# Patient Record
Sex: Male | Born: 1980 | Race: Black or African American | Hispanic: No | Marital: Single | Smoking: Current every day smoker
Health system: Southern US, Community
[De-identification: ages and names within clinical notes are randomized; demographics above are authoritative.]

## PROBLEM LIST (undated history)

## (undated) DIAGNOSIS — F32A Depression, unspecified: Secondary | ICD-10-CM

## (undated) DIAGNOSIS — J3501 Chronic tonsillitis: Principal | ICD-10-CM

## (undated) DIAGNOSIS — F329 Major depressive disorder, single episode, unspecified: Secondary | ICD-10-CM

## (undated) HISTORY — PX: WISDOM TOOTH EXTRACTION: SHX21

---

## 2006-02-11 ENCOUNTER — Emergency Department (HOSPITAL_COMMUNITY): Admission: EM | Admit: 2006-02-11 | Discharge: 2006-02-11 | Payer: Self-pay | Admitting: Emergency Medicine

## 2006-04-26 ENCOUNTER — Emergency Department (HOSPITAL_COMMUNITY): Admission: EM | Admit: 2006-04-26 | Discharge: 2006-04-26 | Payer: Self-pay | Admitting: Emergency Medicine

## 2012-01-03 ENCOUNTER — Inpatient Hospital Stay (HOSPITAL_COMMUNITY)
Admission: EM | Admit: 2012-01-03 | Discharge: 2012-01-08 | DRG: 153 | Disposition: A | Discharge status: Home / Self Care | Payer: 59 | Source: Ambulatory Visit | Attending: Internal Medicine | Admitting: Internal Medicine

## 2012-01-03 ENCOUNTER — Encounter (HOSPITAL_COMMUNITY): Payer: Self-pay | Admitting: Emergency Medicine

## 2012-01-03 DIAGNOSIS — R509 Fever, unspecified: Secondary | ICD-10-CM

## 2012-01-03 DIAGNOSIS — N179 Acute kidney failure, unspecified: Secondary | ICD-10-CM | POA: Diagnosis present

## 2012-01-03 DIAGNOSIS — F17200 Nicotine dependence, unspecified, uncomplicated: Secondary | ICD-10-CM | POA: Diagnosis present

## 2012-01-03 DIAGNOSIS — N289 Disorder of kidney and ureter, unspecified: Secondary | ICD-10-CM | POA: Diagnosis present

## 2012-01-03 DIAGNOSIS — J069 Acute upper respiratory infection, unspecified: Principal | ICD-10-CM | POA: Diagnosis present

## 2012-01-03 DIAGNOSIS — E860 Dehydration: Secondary | ICD-10-CM

## 2012-01-03 DIAGNOSIS — B2790 Infectious mononucleosis, unspecified without complication: Secondary | ICD-10-CM | POA: Diagnosis present

## 2012-01-03 DIAGNOSIS — J029 Acute pharyngitis, unspecified: Secondary | ICD-10-CM

## 2012-01-03 DIAGNOSIS — B349 Viral infection, unspecified: Secondary | ICD-10-CM | POA: Diagnosis present

## 2012-01-03 DIAGNOSIS — J039 Acute tonsillitis, unspecified: Secondary | ICD-10-CM

## 2012-01-03 LAB — RAPID STREP SCREEN (MED CTR MEBANE ONLY): Streptococcus, Group A Screen (Direct): NEGATIVE

## 2012-01-03 MED ORDER — ACETAMINOPHEN 325 MG PO TABS
650.0000 mg | ORAL_TABLET | Freq: Once | ORAL | Status: AC
  Administered 2012-01-03: 650 mg via ORAL
  Filled 2012-01-03: qty 2

## 2012-01-03 NOTE — ED Notes (Signed)
C/o sore throat and fever since yesterday

## 2012-01-04 ENCOUNTER — Emergency Department (HOSPITAL_COMMUNITY): Payer: 59

## 2012-01-04 ENCOUNTER — Encounter (HOSPITAL_COMMUNITY): Payer: Self-pay | Admitting: Emergency Medicine

## 2012-01-04 DIAGNOSIS — B349 Viral infection, unspecified: Secondary | ICD-10-CM | POA: Diagnosis present

## 2012-01-04 DIAGNOSIS — J029 Acute pharyngitis, unspecified: Secondary | ICD-10-CM

## 2012-01-04 DIAGNOSIS — B9789 Other viral agents as the cause of diseases classified elsewhere: Secondary | ICD-10-CM

## 2012-01-04 DIAGNOSIS — E860 Dehydration: Secondary | ICD-10-CM

## 2012-01-04 DIAGNOSIS — N289 Disorder of kidney and ureter, unspecified: Secondary | ICD-10-CM | POA: Diagnosis present

## 2012-01-04 DIAGNOSIS — R3129 Other microscopic hematuria: Secondary | ICD-10-CM

## 2012-01-04 DIAGNOSIS — B2790 Infectious mononucleosis, unspecified without complication: Secondary | ICD-10-CM

## 2012-01-04 LAB — COMPREHENSIVE METABOLIC PANEL
Albumin: 4.4 g/dL (ref 3.5–5.2)
Alkaline Phosphatase: 93 U/L (ref 39–117)
BUN: 13 mg/dL (ref 6–23)
Chloride: 97 mEq/L (ref 96–112)
Glucose, Bld: 113 mg/dL — ABNORMAL HIGH (ref 70–99)
Potassium: 4.2 mEq/L (ref 3.5–5.1)
Total Bilirubin: 0.3 mg/dL (ref 0.3–1.2)

## 2012-01-04 LAB — CBC
HCT: 47.2 % (ref 39.0–52.0)
Hemoglobin: 16 g/dL (ref 13.0–17.0)
RBC: 5.61 MIL/uL (ref 4.22–5.81)
WBC: 19.5 10*3/uL — ABNORMAL HIGH (ref 4.0–10.5)

## 2012-01-04 LAB — DIFFERENTIAL
Basophils Absolute: 0 10*3/uL (ref 0.0–0.1)
Basophils Relative: 0 % (ref 0–1)
Lymphocytes Relative: 15 % (ref 12–46)
Monocytes Relative: 14 % — ABNORMAL HIGH (ref 3–12)
Neutro Abs: 13.9 10*3/uL — ABNORMAL HIGH (ref 1.7–7.7)

## 2012-01-04 LAB — RAPID URINE DRUG SCREEN, HOSP PERFORMED
Barbiturates: NOT DETECTED
Cocaine: NOT DETECTED
Tetrahydrocannabinol: POSITIVE — AB

## 2012-01-04 LAB — PATHOLOGIST SMEAR REVIEW

## 2012-01-04 LAB — EXPECTORATED SPUTUM ASSESSMENT W GRAM STAIN, RFLX TO RESP C

## 2012-01-04 LAB — URINE MICROSCOPIC-ADD ON

## 2012-01-04 LAB — MONONUCLEOSIS SCREEN: Mono Screen: NEGATIVE

## 2012-01-04 LAB — URINALYSIS, ROUTINE W REFLEX MICROSCOPIC
Bilirubin Urine: NEGATIVE
Ketones, ur: 15 mg/dL — AB
Nitrite: NEGATIVE
pH: 5.5 (ref 5.0–8.0)

## 2012-01-04 LAB — POCT I-STAT, CHEM 8
HCT: 51 % (ref 39.0–52.0)
Hemoglobin: 17.3 g/dL — ABNORMAL HIGH (ref 13.0–17.0)
Potassium: 4.1 mEq/L (ref 3.5–5.1)
Sodium: 136 mEq/L (ref 135–145)

## 2012-01-04 LAB — LACTIC ACID, PLASMA: Lactic Acid, Venous: 1.1 mmol/L (ref 0.5–2.2)

## 2012-01-04 MED ORDER — MORPHINE SULFATE 2 MG/ML IJ SOLN
2.0000 mg | Freq: Once | INTRAMUSCULAR | Status: AC
  Administered 2012-01-04: 2 mg via INTRAVENOUS
  Filled 2012-01-04: qty 1

## 2012-01-04 MED ORDER — SODIUM CHLORIDE 0.9 % IV SOLN
1000.0000 mL | Freq: Once | INTRAVENOUS | Status: AC
  Administered 2012-01-04: 1000 mL via INTRAVENOUS

## 2012-01-04 MED ORDER — MORPHINE SULFATE 2 MG/ML IJ SOLN
2.0000 mg | INTRAMUSCULAR | Status: DC | PRN
  Administered 2012-01-06: 2 mg via INTRAVENOUS
  Filled 2012-01-04: qty 1

## 2012-01-04 MED ORDER — ACETAMINOPHEN 325 MG PO TABS: 650.0000 mg | ORAL_TABLET | Freq: Once | ORAL | Status: DC

## 2012-01-04 MED ORDER — SODIUM CHLORIDE 0.9 % IV SOLN
3.0000 g | Freq: Once | INTRAVENOUS | Status: AC
  Administered 2012-01-04: 3 g via INTRAVENOUS
  Filled 2012-01-04: qty 3

## 2012-01-04 MED ORDER — ACETAMINOPHEN 325 MG PO TABS
975.0000 mg | ORAL_TABLET | Freq: Once | ORAL | Status: AC
  Administered 2012-01-04: 975 mg via ORAL
  Filled 2012-01-04: qty 3

## 2012-01-04 MED ORDER — ONDANSETRON HCL 4 MG/2ML IJ SOLN
4.0000 mg | Freq: Once | INTRAMUSCULAR | Status: AC
  Administered 2012-01-04: 4 mg via INTRAVENOUS
  Filled 2012-01-04: qty 2

## 2012-01-04 MED ORDER — ACETAMINOPHEN 325 MG PO TABS
650.0000 mg | ORAL_TABLET | ORAL | Status: DC | PRN
  Administered 2012-01-04 – 2012-01-06 (×7): 650 mg via ORAL
  Filled 2012-01-04: qty 1
  Filled 2012-01-04 (×7): qty 2

## 2012-01-04 MED ORDER — SODIUM CHLORIDE 0.9 % IV SOLN
1.5000 g | Freq: Four times a day (QID) | INTRAVENOUS | Status: DC
  Administered 2012-01-04 – 2012-01-07 (×13): 1.5 g via INTRAVENOUS
  Filled 2012-01-04 (×16): qty 1.5

## 2012-01-04 MED ORDER — PREDNISONE 20 MG PO TABS
40.0000 mg | ORAL_TABLET | Freq: Every day | ORAL | Status: AC
  Administered 2012-01-04 – 2012-01-06 (×3): 40 mg via ORAL
  Filled 2012-01-04 (×4): qty 2

## 2012-01-04 MED ORDER — SODIUM CHLORIDE 0.9 % IV SOLN
1000.0000 mL | INTRAVENOUS | Status: DC
  Administered 2012-01-04: 1000 mL via INTRAVENOUS

## 2012-01-04 MED ORDER — SODIUM CHLORIDE 0.9 % IJ SOLN
3.0000 mL | Freq: Two times a day (BID) | INTRAMUSCULAR | Status: DC
  Administered 2012-01-04 – 2012-01-07 (×3): 3 mL via INTRAVENOUS

## 2012-01-04 MED ORDER — ACETAMINOPHEN 500 MG PO TABS
500.0000 mg | ORAL_TABLET | Freq: Once | ORAL | Status: DC
  Filled 2012-01-04: qty 1

## 2012-01-04 MED ORDER — ENOXAPARIN SODIUM 40 MG/0.4ML ~~LOC~~ SOLN
40.0000 mg | SUBCUTANEOUS | Status: DC
  Filled 2012-01-04 (×3): qty 0.4

## 2012-01-04 MED ORDER — SODIUM CHLORIDE 0.9 % IV SOLN
INTRAVENOUS | Status: DC
  Administered 2012-01-04: 08:00:00 via INTRAVENOUS

## 2012-01-04 MED ORDER — IOHEXOL 300 MG/ML  SOLN
50.0000 mL | Freq: Once | INTRAMUSCULAR | Status: AC | PRN
  Administered 2012-01-04: 50 mL via INTRAVENOUS

## 2012-01-04 MED ORDER — SODIUM CHLORIDE 0.9 % IV BOLUS (SEPSIS)
1000.0000 mL | Freq: Once | INTRAVENOUS | Status: AC
  Administered 2012-01-04: 1000 mL via INTRAVENOUS

## 2012-01-04 NOTE — Progress Notes (Signed)
Subjective: Patient denies headache, he had some dizziness. He denies muscle pain, abdominal pain.  No cough. Some difficulty with swallowing, mild SOB. Had 1 episode of diarrhea last Tuesday.   Objective: Filed Vitals:   01/04/12 0518 01/04/12 0524 01/04/12 0627 01/04/12 0737  BP: 133/75 133/75 121/63   Pulse:   118   Temp: 103 F (39.4 C) 103 F (39.4 C) 102.9 F (39.4 C) 99.9 F (37.7 C)  TempSrc: Oral  Oral Oral  Resp: 18 18 16    Height:   5\' 10"  (1.778 m)   Weight:   99.791 kg (220 lb)   SpO2: 100% 98% 99%    Weight change:    General: Alert, awake, oriented x3, in no acute distress.  HEENT: No bruits, no goiter.  Heart: Regular rate and rhythm, without murmurs, rubs, gallops.  Lungs: CTA, bilateral air movement.  Abdomen: Soft, nontender, nondistended, positive bowel sounds.  Neuro: Grossly intact, nonfocal. Extremities; no edema, no rash.   Lab Results:  [facility] 01/04/12 0103 01/04/12 0041  NA 136 133*  K 4.1 4.2  CL 102 97  CO2 -- 21  GLUCOSE 115* 113*  BUN 13 13  CREATININE 2.00* 1.80*  CALCIUM -- 10.2  MG -- --  PHOS -- --    Basename 01/04/12 0041  AST 23  ALT 35  ALKPHOS 93  BILITOT 0.3  PROT 9.0*  ALBUMIN 4.4   No results found for this basename: LIPASE:2,AMYLASE:2 in the last 72 hours  Basename 01/04/12 0103 01/04/12 0041  WBC -- 19.5*  NEUTROABS -- 13.9*  HGB 17.3* 16.0  HCT 51.0 47.2  MCV -- 84.1  PLT -- 242    Micro Results: Recent Results (from the past 240 hour(s))  RAPID STREP SCREEN     Status: Normal   Collection Time   01/03/12 11:31 PM      Component Value Range Status Comment   Streptococcus, Group A Screen (Direct) NEGATIVE  NEGATIVE Final     Studies/Results: Dg Neck Soft Tissue  01/04/2012  *RADIOLOGY REPORT*  Clinical Data: Fever and sore throat for 3 days.  NECK SOFT TISSUES - 1+ VIEW  Comparison: None.  Findings: Prevertebral soft tissues are within normal limits. Epiglottis within normal limits.   Aryepiglottic folds not thickened.  Soft tissue fullness at the region of the piriform sinuses is favored to be due to underdistension.  No airway impingement.  IMPRESSION: No acute findings.  Original Report Authenticated By: Consuello Bossier, M.D.   Ct Soft Tissue Neck W Contrast  01/04/2012  *RADIOLOGY REPORT*  Clinical Data: Fever, sore throat.  CT NECK WITH CONTRAST  Technique:  Multidetector CT imaging of the neck was performed with intravenous contrast.  Contrast: 50mL OMNIPAQUE IOHEXOL 300 MG/ML  SOLN  Comparison: 01/04/2012 radiograph  Findings: Lung apices are clear.  Limited images through the posterior fossa/lower temporal lobes show no acute intracranial finding.  Partially opacified right maxillary sinus may represent a polyp. A small polyp versus mucosal thickening within each sphenoid chamber. Visualized paranasal sinuses and mastoid air cells are otherwise predominately clear.  Unremarkable nasal cavity and nasopharynx.  Unremarkable oral cavity.  Bilateral tonsillar fullness narrows the oropharyngeal airway however, no peritonsillar abscess identified.  Unremarkable hypopharynx and epiglottis.  Unremarkable larynx.  Symmetric parotid and submandibular glands.  There are bilateral prominent cervical chain lymph nodes, measuring up to 1.6 cm short axis.  Normal caliber vasculature.  Unremarkable thyroid gland.  No acute osseous finding.  IMPRESSION: Tonsillar fullness narrows the  oropharyngeal airway without peritonsillar abscess identified.  Prominent bilateral cervical chain lymph nodes.  May be reactive however recommend continued attention with physical exam and follow up imaging if warranted to document resolution.  Original Report Authenticated By: Waneta Martins, M.D.   Dg Chest Port 1 View  01/04/2012  *RADIOLOGY REPORT*  Clinical Data: Fever, sore throat.  PORTABLE CHEST - 1 VIEW  Comparison: None.  Findings: Lungs are clear. No pleural effusion or pneumothorax. The cardiomediastinal  contours are within normal limits. The visualized bones and soft tissues are without significant appreciable abnormality.  IMPRESSION: No radiographic evidence of acute cardiopulmonary process.  Original Report Authenticated By: Waneta Martins, M.D.    Medications: I have reviewed the patient's current medications.  1-Pharyngitis: Continue with unasyn. MONO negative. Will monitor closely for airway compromise.   2-Fever, Leukocytosis: Probably related to Pharyngitis, probably bacterial infection, ANC at 13. No headaches, no neck stiffness. I will check HIV. Will follow Blood culture, UA. Strept group A negative. Will check GC/Chlamydia. LFT normal.   3-Acute renal failure: probably pre-renal. IV fluids. Also patient received contrast for CT neck. Strict I and O. Will consider renal US if no improvement.     LOS: 1 day   Allsion Nogales M.D.  [facility] 01/04/2012, 8:25 AM

## 2012-01-04 NOTE — Care Management Note (Signed)
    Page 1 of 1   01/04/2012     11:26:41 AM   CARE MANAGEMENT NOTE 01/04/2012  Patient:  ACY, ORSAK   Account Number:  [id number]  Date Initiated:  01/04/2012  Documentation initiated by:  GRAVES-BIGELOW,Skye Rodarte  Subjective/Objective Assessment:   Pt admitted with fever and sore throat. 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 Pt will bring card for hospital to copy.     Action/Plan:   CM will provide pt with the [facility] number in order to find a PCP. No further needs from CM at this time.   Anticipated DC Date:  01/07/2012   Anticipated DC Plan:  HOME/SELF CARE      DC Planning Services  CM consult      Choice offered to / List presented to:             Status of service:  Completed, signed off Medicare Important Message given?   (If response is "NO", the following Medicare IM given date fields will be blank) Date Medicare IM given:   Date Additional Medicare IM given:    Discharge Disposition:  HOME/SELF CARE  Per UR Regulation:  Reviewed for med. necessity/level of care/duration of stay  If discussed at Long Length of Stay Meetings, dates discussed:    Comments:

## 2012-01-04 NOTE — ED Notes (Signed)
Patient transported to CT 

## 2012-01-04 NOTE — ED Notes (Signed)
Patient with three day history of sore throat, increasing shortness of breath and swollen tonsils.  Patient is febrile.  Patient has been nauseated, no history of vomiting.

## 2012-01-04 NOTE — H&P (Signed)
PCP: None   Chief Complaint: Sore throat, fever, chills   HPI: Alexander Franklin is an 31 y.o. male with benign past medical history on no chronic medication presents to emergency room with three-day history of sore throat, odynophagia, fever, chills, and one-day history of loose stool. He denied shortness of breath, cough, abdominal cramps or pain, nausea or vomiting, dysuria or polyuria. He has not been out of the country, denied any drug use, denied any increased risk for STD. His girlfriend had upper respiratory symptoms one week prior and was placed on Biaxin. Evaluation in emergency room included a white count of 19.5 thousand with atypical lymphocytes. He has elevated creatinine to 1.8, serum sodium 133, and normal liver function tests. Rapid strep is negative. His CT scan of the neck shows enlarged tonsils narrowing its space, but no abcesses. Urinalysis is positive for 7-10 RBC, rare bacteria and 0-2 wbc. Hospitalist was asked to admit patient for renal insufficiency, and further evaluation pharyngitis.  Rewiew of Systems:  The patient denies anorexia, weight loss,, vision loss, decreased hearing, hoarseness, chest pain, syncope, dyspnea on exertion, peripheral edema, balance deficits, hemoptysis, abdominal pain, melena, hematochezia, severe indigestion/heartburn, hematuria, incontinence, genital sores, muscle weakness, suspicious skin lesions, transient blindness, difficulty walking, depression, unusual weight change, abnormal bleeding,  angioedema, and breast masses.    History reviewed. No pertinent past medical history.  History reviewed. No pertinent past surgical history.  Medications:  HOME MEDS: Prior to Admission medications   Medication Sig Start Date End Date Taking? Authorizing Provider  Phenylephrine-Pheniramine-DM [facility] COLD & COUGH PO) Take 1 tablet by mouth every 6 (six) hours as needed. For cold symptoms   Yes Historical Provider, MD  Pseudoeph-Doxylamine-DM-APAP  (NYQUIL PO) Take 30 mLs by mouth every 6 (six) hours as needed. For cough symptoms   Yes Historical Provider, MD     Allergies:  No Known Allergies  Social History:   reports that he has been smoking.  He does not have any smokeless tobacco history on file. He reports that he drinks alcohol. He reports that he does not use illicit drugs.  Family History: History reviewed. No pertinent family history.   Physical Exam: Filed Vitals:   01/04/12 0410 01/04/12 0443 01/04/12 0518 01/04/12 0524  BP:  131/87 133/75 133/75  Pulse:      Temp: 102.9 F (39.4 C)  103 F (39.4 C) 103 F (39.4 C)  TempSrc:   Oral   Resp:  16 18 18   SpO2:  100% 100% 98%   Blood pressure 133/75, pulse 137, temperature 103 F (39.4 C), temperature source Oral, resp. rate 18, SpO2 98.00%.  GEN:  Pleasant  person lying in the stretcher in no acute distress; cooperative with exam PSYCH:  alert and oriented x4; does not appear anxious or depressed; affect is appropriate. HEENT: Mucous membranes pink and anicteric; PERRLA; EOM intact; no cervical lymphadenopathy nor thyromegaly or carotid bruit; no JVD; he has exudative tonsils. His next is supple Breasts:: Not examined CHEST WALL: No tenderness CHEST: Normal respiration, clear to auscultation bilaterally HEART: Regular rate and rhythm; no murmurs rubs or gallops BACK: No kyphosis or scoliosis; no CVA tenderness ABDOMEN: Obese, soft non-tender; no masses, no organomegaly, normal abdominal bowel sounds; no pannus; no intertriginous candida. Rectal Exam: Not done EXTREMITIES: No bone or joint deformity; age-appropriate arthropathy of the hands and knees; no edema; no ulcerations. Genitalia: not examined PULSES: 2+ and symmetric SKIN: Normal hydration no rash or ulceration CNS: Cranial nerves 2-12 grossly intact no  focal lateralizing neurologic deficit   Labs & Imaging Results for orders placed during the hospital encounter of 01/03/12 (from the past 48  hour(s))  RAPID STREP SCREEN     Status: Normal   Collection Time   01/03/12 11:31 PM      Component Value Range Comment   Streptococcus, Group A Screen (Direct) NEGATIVE  NEGATIVE   CBC     Status: Abnormal   Collection Time   01/04/12 12:41 AM      Component Value Range Comment   WBC 19.5 (*) 4.0 - 10.5 K/uL    RBC 5.61  4.22 - 5.81 MIL/uL    Hemoglobin 16.0  13.0 - 17.0 g/dL    HCT 16.1  09.6 - 04.5 %    MCV 84.1  78.0 - 100.0 fL    MCH 28.5  26.0 - 34.0 pg    MCHC 33.9  30.0 - 36.0 g/dL    RDW 40.9  81.1 - 91.4 %    Platelets 242  150 - 400 K/uL   DIFFERENTIAL     Status: Abnormal   Collection Time   01/04/12 12:41 AM      Component Value Range Comment   Neutrophils Relative 71  43 - 77 %    Lymphocytes Relative 15  12 - 46 %    Monocytes Relative 14 (*) 3 - 12 %    Eosinophils Relative 0  0 - 5 %    Basophils Relative 0  0 - 1 %    Neutro Abs 13.9 (*) 1.7 - 7.7 K/uL    Lymphs Abs 2.9  0.7 - 4.0 K/uL    Monocytes Absolute 2.7 (*) 0.1 - 1.0 K/uL    Eosinophils Absolute 0.0  0.0 - 0.7 K/uL    Basophils Absolute 0.0  0.0 - 0.1 K/uL    WBC Morphology ATYPICAL LYMPHOCYTES     COMPREHENSIVE METABOLIC PANEL     Status: Abnormal   Collection Time   01/04/12 12:41 AM      Component Value Range Comment   Sodium 133 (*) 135 - 145 mEq/L    Potassium 4.2  3.5 - 5.1 mEq/L    Chloride 97  96 - 112 mEq/L    CO2 21  19 - 32 mEq/L    Glucose, Bld 113 (*) 70 - 99 mg/dL    BUN 13  6 - 23 mg/dL    Creatinine, Ser 7.82 (*) 0.50 - 1.35 mg/dL    Calcium 95.6  8.4 - 10.5 mg/dL    Total Protein 9.0 (*) 6.0 - 8.3 g/dL    Albumin 4.4  3.5 - 5.2 g/dL    AST 23  0 - 37 U/L    ALT 35  0 - 53 U/L    Alkaline Phosphatase 93  39 - 117 U/L    Total Bilirubin 0.3  0.3 - 1.2 mg/dL    GFR calc non Af Amer 48 (*) >90 mL/min    GFR calc Af Amer 56 (*) >90 mL/min   LACTIC ACID, PLASMA     Status: Normal   Collection Time   01/04/12 12:42 AM      Component Value Range Comment   Lactic Acid, Venous 1.1   0.5 - 2.2 mmol/L   POCT I-STAT, CHEM 8     Status: Abnormal   Collection Time   01/04/12  1:03 AM      Component Value Range Comment   Sodium 136  135 - 145  mEq/L    Potassium 4.1  3.5 - 5.1 mEq/L    Chloride 102  96 - 112 mEq/L    BUN 13  6 - 23 mg/dL    Creatinine, Ser 1.61 (*) 0.50 - 1.35 mg/dL    Glucose, Bld 096 (*) 70 - 99 mg/dL    Calcium, Ion 0.45  4.09 - 1.32 mmol/L    TCO2 22  0 - 100 mmol/L    Hemoglobin 17.3 (*) 13.0 - 17.0 g/dL    HCT 81.1  91.4 - 78.2 %   URINALYSIS, ROUTINE W REFLEX MICROSCOPIC     Status: Abnormal   Collection Time   01/04/12  1:44 AM      Component Value Range Comment   Color, Urine AMBER (*) YELLOW BIOCHEMICALS MAY BE AFFECTED BY COLOR   APPearance CLEAR  CLEAR    Specific Gravity, Urine 1.036 (*) 1.005 - 1.030    pH 5.5  5.0 - 8.0    Glucose, UA NEGATIVE  NEGATIVE mg/dL    Hgb urine dipstick MODERATE (*) NEGATIVE    Bilirubin Urine NEGATIVE  NEGATIVE    Ketones, ur 15 (*) NEGATIVE mg/dL    Protein, ur 956 (*) NEGATIVE mg/dL    Urobilinogen, UA 0.2  0.0 - 1.0 mg/dL    Nitrite NEGATIVE  NEGATIVE    Leukocytes, UA TRACE (*) NEGATIVE   URINE MICROSCOPIC-ADD ON     Status: Abnormal   Collection Time   01/04/12  1:44 AM      Component Value Range Comment   Squamous Epithelial / LPF RARE  RARE    WBC, UA 0-2  <3 WBC/hpf    RBC / HPF 7-10  <3 RBC/hpf    Bacteria, UA FEW (*) RARE   LACTIC ACID, PLASMA     Status: Normal   Collection Time   01/04/12  4:13 AM      Component Value Range Comment   Lactic Acid, Venous 1.0  0.5 - 2.2 mmol/L   MONONUCLEOSIS SCREEN     Status: Normal   Collection Time   01/04/12  4:45 AM      Component Value Range Comment   Mono Screen NEGATIVE  NEGATIVE    Dg Neck Soft Tissue  01/04/2012  *RADIOLOGY REPORT*  Clinical Data: Fever and sore throat for 3 days.  NECK SOFT TISSUES - 1+ VIEW  Comparison: None.  Findings: Prevertebral soft tissues are within normal limits. Epiglottis within normal limits.  Aryepiglottic  folds not thickened.  Soft tissue fullness at the region of the piriform sinuses is favored to be Franklin to underdistension.  No airway impingement.  IMPRESSION: No acute findings.  Original Report Authenticated By: Consuello Bossier, M.D.   Ct Soft Tissue Neck W Contrast  01/04/2012  *RADIOLOGY REPORT*  Clinical Data: Fever, sore throat.  CT NECK WITH CONTRAST  Technique:  Multidetector CT imaging of the neck was performed with intravenous contrast.  Contrast: 50mL OMNIPAQUE IOHEXOL 300 MG/ML  SOLN  Comparison: 01/04/2012 radiograph  Findings: Lung apices are clear.  Limited images through the posterior fossa/lower temporal lobes show no acute intracranial finding.  Partially opacified right maxillary sinus may represent a polyp. A small polyp versus mucosal thickening within each sphenoid chamber. Visualized paranasal sinuses and mastoid air cells are otherwise predominately clear.  Unremarkable nasal cavity and nasopharynx.  Unremarkable oral cavity.  Bilateral tonsillar fullness narrows the oropharyngeal airway however, no peritonsillar abscess identified.  Unremarkable hypopharynx and epiglottis.  Unremarkable larynx.  Symmetric  parotid and submandibular glands.  There are bilateral prominent cervical chain lymph nodes, measuring up to 1.6 cm short axis.  Normal caliber vasculature.  Unremarkable thyroid gland.  No acute osseous finding.  IMPRESSION: Tonsillar fullness narrows the oropharyngeal airway without peritonsillar abscess identified.  Prominent bilateral cervical chain lymph nodes.  May be reactive however recommend continued attention with physical exam and follow up imaging if warranted to document resolution.  Original Report Authenticated By: Waneta Martins, M.D.   Dg Chest Port 1 View  01/04/2012  *RADIOLOGY REPORT*  Clinical Data: Fever, sore throat.  PORTABLE CHEST - 1 VIEW  Comparison: None.  Findings: Lungs are clear. No pleural effusion or pneumothorax. The cardiomediastinal contours are  within normal limits. The visualized bones and soft tissues are without significant appreciable abnormality.  IMPRESSION: No radiographic evidence of acute cardiopulmonary process.  Original Report Authenticated By: Waneta Martins, M.D.      Assessment Present on Admission:  .Pharyngitis, acute .Viral syndrome .Infectious mononucleosis .Renal insufficiency   PLAN:  I suspect that he has infectious mononucleosis given pharyngitis, cervical adenopathy, extremely fatigued, and leukocytosis with atypical  lymphocytosis. But just in case he has a bacterial infection, we'll treat her with Unasyn. I will also give him prednisone 40 mg per day for 3 days. Cultures will be done.  He also has elevated creatinine, most likely from volume depletion. He does have hematuria, and his urine will be cultured. He is stable, full code, and will be admitted to [facility] under observation status.  Other plans as per orders.    Charitie Hinote 01/04/2012, 5:37 AM

## 2012-01-04 NOTE — ED Provider Notes (Signed)
History     CSN: [id number]  Arrival date & time 01/03/12  2313   First MD Initiated Contact with Patient 01/04/12 0031      Chief Complaint  Patient presents with  . Fever  . Sore Throat    (Consider location/radiation/quality/duration/timing/severity/associated sxs/prior treatment) HPI Patient is a 31 year old male with no prior history of health problems who presents today with fever of 103 and tachycardia to 137. He reports a sore throat as well as fatigue and voice change. Patient has significant other who is recently had upper respiratory infection. Patient endorses decreased by mouth intake as well as nausea without vomiting. He denies chest pain, constipation, diarrhea, or abdominal pain. He does endorse a mild cough. Patient rates the pain as an 8/10 and says that is most severe in his throat. Patient is able to speak and is having no difficulty controlling his secretions or breathing. There is no stridor today. Nothing makes the patient's symptoms better or worse. There are no other associated or modifying factors. History reviewed. No pertinent past medical history.  History reviewed. No pertinent past surgical history.  History reviewed. No pertinent family history.  History  Substance Use Topics  . Smoking status: Current Everyday Smoker  . Smokeless tobacco: Not on file  . Alcohol Use: Yes      Review of Systems  Constitutional: Positive for fever, chills, activity change, appetite change and fatigue.  HENT: Positive for congestion, sore throat, trouble swallowing and voice change.   Eyes: Negative.   Respiratory: Positive for cough.   Cardiovascular: Negative.   Gastrointestinal: Positive for nausea.  Genitourinary: Negative.   Musculoskeletal: Negative.   Skin: Negative.   Neurological: Negative.   Hematological: Negative.   Psychiatric/Behavioral: Negative.   All other systems reviewed and are negative.    Allergies  Review of patient's allergies  indicates no known allergies.  Home Medications   Current Outpatient Rx  Name Route Sig Dispense Refill  . THERAFLU COLD & COUGH PO Oral Take 1 tablet by mouth every 6 (six) hours as needed. For cold symptoms    . NYQUIL PO Oral Take 30 mLs by mouth every 6 (six) hours as needed. For cough symptoms      BP 131/87  Pulse 137  Temp 102.9 F (39.4 C) (Oral)  Resp 16  SpO2 100%  Physical Exam  Nursing note and vitals reviewed. GEN: Well-developed, well-nourished male tachycardic and febrile HEENT: Atraumatic, normocephalic. Posterior oropharynx demonstrates bilateral tonsillar swelling without exudates. Swelling is symmetric. Bilateral anterior cervical lymphadenopathy noted. EYES: PERRLA BL, no scleral icterus. NECK: Trachea midline, no meningismus. Pain-free full range of motion. CV: Tachycardic with regular rhythm. No murmurs, rubs, or gallops PULM: No respiratory distress.  No crackles, wheezes, or rales. GI: soft, non-tender. No guarding, rebound, or tenderness. + bowel sounds  GU: deferred Neuro: cranial nerves grossly 2-12 intact, no abnormalities of strength or sensation, A and O x 3 MSK: Patient moves all 4 extremities symmetrically, no deformity, edema, or injury noted Skin: No rashes petechiae, purpura, or jaundice Psych: no abnormality of mood   ED Course  Procedures (including critical care time)  Indication: tachycardia Please note this EKG was reviewed extemporaneously by myself.  Date: 01/04/2012  Rate: 111   Date: 01/04/2012  Rate: 111  Rhythm: sinus tachycardia  QRS Axis: normal  Intervals: normal  ST/T Wave abnormalities: normal  Conduction Disutrbances:none  Narrative Interpretation:   Old EKG Reviewed: none available     Labs Reviewed  CBC -  Abnormal; Notable for the following:    WBC 19.5 (*)     All other components within normal limits  DIFFERENTIAL - Abnormal; Notable for the following:    Monocytes Relative 14 (*)     Neutro Abs 13.9  (*)     Monocytes Absolute 2.7 (*)     All other components within normal limits  COMPREHENSIVE METABOLIC PANEL - Abnormal; Notable for the following:    Sodium 133 (*)     Glucose, Bld 113 (*)     Creatinine, Ser 1.80 (*)     Total Protein 9.0 (*)     GFR calc non Af Amer 48 (*)     GFR calc Af Amer 56 (*)     All other components within normal limits  URINALYSIS, ROUTINE W REFLEX MICROSCOPIC - Abnormal; Notable for the following:    Color, Urine AMBER (*)  BIOCHEMICALS MAY BE AFFECTED BY COLOR   Specific Gravity, Urine 1.036 (*)     Hgb urine dipstick MODERATE (*)     Ketones, ur 15 (*)     Protein, ur 100 (*)     Leukocytes, UA TRACE (*)     All other components within normal limits  POCT I-STAT, CHEM 8 - Abnormal; Notable for the following:    Creatinine, Ser 2.00 (*)     Glucose, Bld 115 (*)     Hemoglobin 17.3 (*)     All other components within normal limits  URINE MICROSCOPIC-ADD ON - Abnormal; Notable for the following:    Bacteria, UA FEW (*)     All other components within normal limits  RAPID STREP SCREEN  LACTIC ACID, PLASMA  LACTIC ACID, PLASMA  URINE CULTURE  CULTURE, BLOOD (ROUTINE X 2)  CULTURE, BLOOD (ROUTINE X 2)  MONONUCLEOSIS SCREEN   Dg Neck Soft Tissue  01/04/2012  *RADIOLOGY REPORT*  Clinical Data: Fever and sore throat for 3 days.  NECK SOFT TISSUES - 1+ VIEW  Comparison: None.  Findings: Prevertebral soft tissues are within normal limits. Epiglottis within normal limits.  Aryepiglottic folds not thickened.  Soft tissue fullness at the region of the piriform sinuses is favored to be due to underdistension.  No airway impingement.  IMPRESSION: No acute findings.  Original Report Authenticated By: Consuello Bossier, M.D.   Ct Soft Tissue Neck W Contrast  01/04/2012  *RADIOLOGY REPORT*  Clinical Data: Fever, sore throat.  CT NECK WITH CONTRAST  Technique:  Multidetector CT imaging of the neck was performed with intravenous contrast.  Contrast: 50mL OMNIPAQUE  IOHEXOL 300 MG/ML  SOLN  Comparison: 01/04/2012 radiograph  Findings: Lung apices are clear.  Limited images through the posterior fossa/lower temporal lobes show no acute intracranial finding.  Partially opacified right maxillary sinus may represent a polyp. A small polyp versus mucosal thickening within each sphenoid chamber. Visualized paranasal sinuses and mastoid air cells are otherwise predominately clear.  Unremarkable nasal cavity and nasopharynx.  Unremarkable oral cavity.  Bilateral tonsillar fullness narrows the oropharyngeal airway however, no peritonsillar abscess identified.  Unremarkable hypopharynx and epiglottis.  Unremarkable larynx.  Symmetric parotid and submandibular glands.  There are bilateral prominent cervical chain lymph nodes, measuring up to 1.6 cm short axis.  Normal caliber vasculature.  Unremarkable thyroid gland.  No acute osseous finding.  IMPRESSION: Tonsillar fullness narrows the oropharyngeal airway without peritonsillar abscess identified.  Prominent bilateral cervical chain lymph nodes.  May be reactive however recommend continued attention with physical exam and follow up imaging if warranted  to document resolution.  Original Report Authenticated By: Waneta Martins, M.D.   Dg Chest Port 1 View  01/04/2012  *RADIOLOGY REPORT*  Clinical Data: Fever, sore throat.  PORTABLE CHEST - 1 VIEW  Comparison: None.  Findings: Lungs are clear. No pleural effusion or pneumothorax. The cardiomediastinal contours are within normal limits. The visualized bones and soft tissues are without significant appreciable abnormality.  IMPRESSION: No radiographic evidence of acute cardiopulmonary process.  Original Report Authenticated By: Waneta Martins, M.D.     1. Fever   2. Tonsillitis       MDM  Patient was evaluated by myself. Patient presented with tachycardia to the 130s and fever to 103. He had received Tylenol one hour prior to my evaluation. Workup for possible sepsis was  performed. Patient did have sore throat. Strep test was negative. Chest x-ray and urinalysis did not indicate an explanation for the patient's symptoms. Airway was intact and patient did not have stridor. He did however have voice changes and I was most concerned about possible infectious source in his throat. Plain film of the neck was performed initially given patient's elevated creatinine on i-STAT. Patient was hydrated and received a total of 3 L of normal saline IV in the ED. Lactate was 1.1. Patient had no electrolyte abnormalities to accompany his acute renal insufficiency. EKG showed only sinus tachycardia. Patient was given Zofran for nausea as well as morphine for his pain. Following return of complete metabolic panel GFR was noted to be 56 and CT of the neck with IV contrast was performed with half contrast dye load. This did not show any definite fluid collections but did show tonsillar enlargement as well as cervical adenopathy. Unasyn had been ordered initially given concern for possible abscess to broadly cover given patient's degree of illness. His heart rate improved and patient remained normotensive. He did continue to run a fever despite acetaminophen and IV fluids. Other NSAIDs were not provided given renal insufficiency. Following return of studies patient was admitted to hospitalist. A Monospot and repeat lactate were sent. Monospot was negative and repeat lactate was 1 which was less than previous value. Patient will be admitted to hospitalist for further management.        Cyndra Numbers, MD 01/04/12 [phone]

## 2012-01-04 NOTE — ED Notes (Signed)
Patient returned from CT

## 2012-01-04 NOTE — Progress Notes (Signed)
UR Completed Raihana Balderrama Graves-Bigelow, RN,BSN [phone]  

## 2012-01-05 DIAGNOSIS — B2790 Infectious mononucleosis, unspecified without complication: Secondary | ICD-10-CM

## 2012-01-05 DIAGNOSIS — B9789 Other viral agents as the cause of diseases classified elsewhere: Secondary | ICD-10-CM

## 2012-01-05 DIAGNOSIS — J029 Acute pharyngitis, unspecified: Secondary | ICD-10-CM

## 2012-01-05 DIAGNOSIS — R3129 Other microscopic hematuria: Secondary | ICD-10-CM

## 2012-01-05 LAB — URINE CULTURE
Colony Count: NO GROWTH
Colony Count: NO GROWTH
Culture  Setup Time: 201306130820
Culture  Setup Time: 201306131206
Culture: NO GROWTH
Special Requests: NORMAL

## 2012-01-05 LAB — BASIC METABOLIC PANEL
CO2: 22 mEq/L (ref 19–32)
Calcium: 9.1 mg/dL (ref 8.4–10.5)
Chloride: 106 mEq/L (ref 96–112)
Creatinine, Ser: 1.22 mg/dL (ref 0.50–1.35)
Glucose, Bld: 99 mg/dL (ref 70–99)

## 2012-01-05 LAB — GC/CHLAMYDIA PROBE AMP, URINE
Chlamydia, Swab/Urine, PCR: NEGATIVE
GC Probe Amp, Urine: NEGATIVE

## 2012-01-05 LAB — CBC
HCT: 38 % — ABNORMAL LOW (ref 39.0–52.0)
MCH: 28.1 pg (ref 26.0–34.0)
MCV: 83.3 fL (ref 78.0–100.0)
RDW: 14.2 % (ref 11.5–15.5)
WBC: 21 10*3/uL — ABNORMAL HIGH (ref 4.0–10.5)

## 2012-01-05 MED ORDER — SODIUM CHLORIDE 0.9 % IV SOLN
INTRAVENOUS | Status: DC
  Administered 2012-01-05 – 2012-01-08 (×5): via INTRAVENOUS

## 2012-01-05 NOTE — Progress Notes (Signed)
Subjective: Feeling ok. No worsening dysphagia or difficulty breathing.   Objective: Filed Vitals:   01/04/12 1400 01/04/12 1800 01/04/12 2100 01/05/12 0500  BP: 123/69  117/66 129/96  Pulse: 106  79 106  Temp: 99.3 F (37.4 C) 98.6 F (37 C) 98.3 F (36.8 C) 98.9 F (37.2 C)  TempSrc: Oral Oral    Resp: 18  20 20   Height:      Weight:      SpO2: 99%  100% 98%   Weight change:    General: Alert, awake, oriented x3, in no acute distress.  HEENT: No bruits, no goiter. Tonsillar enlargement with white secretion.  Heart: Regular rate and rhythm, without murmurs, rubs, gallops.  Lungs: CTA, bilateral air movement.  Abdomen: Soft, nontender, nondistended, positive bowel sounds.  Neuro: Grossly intact, nonfocal. Extremities; no edema.   Lab Results:  Basename 01/05/12 0649 01/04/12 0103 01/04/12 0041  NA 138 136 --  K 4.4 4.1 --  CL 106 102 --  CO2 22 -- 21  GLUCOSE 99 115* --  BUN 11 13 --  CREATININE 1.22 2.00* --  CALCIUM 9.1 -- 10.2  MG -- -- --  PHOS -- -- --    Basename 01/04/12 0041  AST 23  ALT 35  ALKPHOS 93  BILITOT 0.3  PROT 9.0*  ALBUMIN 4.4   Basename 01/05/12 0649 01/04/12 0103 01/04/12 0041  WBC 21.0* -- 19.5*  NEUTROABS -- -- 13.9*  HGB 12.8* 17.3* --  HCT 38.0* 51.0 --  MCV 83.3 -- 84.1  PLT 173 -- 242    Micro Results: Recent Results (from the past 240 hour(s))  RAPID STREP SCREEN     Status: Normal   Collection Time   01/03/12 11:31 PM      Component Value Range Status Comment   Streptococcus, Group A Screen (Direct) NEGATIVE  NEGATIVE Final   CULTURE, BLOOD (ROUTINE X 2)     Status: Normal (Preliminary result)   Collection Time   01/04/12 12:43 AM      Component Value Range Status Comment   Specimen Description BLOOD RIGHT ARM   Final    Special Requests BOTTLES DRAWN AEROBIC AND ANAEROBIC 5CC   Final    Culture  Setup Time [id number]   Final    Culture     Final    Value:        BLOOD CULTURE RECEIVED NO GROWTH TO DATE CULTURE  WILL BE HELD FOR 5 DAYS BEFORE ISSUING A FINAL NEGATIVE REPORT   Report Status PENDING   Incomplete   CULTURE, BLOOD (ROUTINE X 2)     Status: Normal (Preliminary result)   Collection Time   01/04/12 12:44 AM      Component Value Range Status Comment   Specimen Description BLOOD RIGHT ARM   Final    Special Requests BOTTLES DRAWN AEROBIC ONLY 5CC   Final    Culture  Setup Time [id number]   Final    Culture     Final    Value:        BLOOD CULTURE RECEIVED NO GROWTH TO DATE CULTURE WILL BE HELD FOR 5 DAYS BEFORE ISSUING A FINAL NEGATIVE REPORT   Report Status PENDING   Incomplete   URINE CULTURE     Status: Normal   Collection Time   01/04/12  1:44 AM      Component Value Range Status Comment   Specimen Description URINE, RANDOM   Final    Special Requests Normal  Final    Culture  Setup Time [id number]   Final    Colony Count NO GROWTH   Final    Culture NO GROWTH   Final    Report Status 01/05/2012 FINAL   Final   CULTURE, EXPECTORATED SPUTUM-ASSESSMENT     Status: Normal   Collection Time   01/04/12  1:03 PM      Component Value Range Status Comment   Specimen Description SPUTUM   Final    Special Requests NONE   Final    Sputum evaluation     Final    Value: MICROSCOPIC FINDINGS SUGGEST THAT THIS SPECIMEN IS NOT REPRESENTATIVE OF LOWER RESPIRATORY SECRETIONS. PLEASE RECOLLECT.     CALLED TO Christena Deem, RN 01/04/12 1345 BY K SCHULTZ   Report Status 01/04/2012 FINAL   Final     Studies/Results: Dg Neck Soft Tissue  01/04/2012  *RADIOLOGY REPORT*  Clinical Data: Fever and sore throat for 3 days.  NECK SOFT TISSUES - 1+ VIEW  Comparison: None.  Findings: Prevertebral soft tissues are within normal limits. Epiglottis within normal limits.  Aryepiglottic folds not thickened.  Soft tissue fullness at the region of the piriform sinuses is favored to be due to underdistension.  No airway impingement.  IMPRESSION: No acute findings.  Original Report Authenticated By: Consuello Bossier,  M.D.   Ct Soft Tissue Neck W Contrast  01/04/2012  *RADIOLOGY REPORT*  Clinical Data: Fever, sore throat.  CT NECK WITH CONTRAST  Technique:  Multidetector CT imaging of the neck was performed with intravenous contrast.  Contrast: 50mL OMNIPAQUE IOHEXOL 300 MG/ML  SOLN  Comparison: 01/04/2012 radiograph  Findings: Lung apices are clear.  Limited images through the posterior fossa/lower temporal lobes show no acute intracranial finding.  Partially opacified right maxillary sinus may represent a polyp. A small polyp versus mucosal thickening within each sphenoid chamber. Visualized paranasal sinuses and mastoid air cells are otherwise predominately clear.  Unremarkable nasal cavity and nasopharynx.  Unremarkable oral cavity.  Bilateral tonsillar fullness narrows the oropharyngeal airway however, no peritonsillar abscess identified.  Unremarkable hypopharynx and epiglottis.  Unremarkable larynx.  Symmetric parotid and submandibular glands.  There are bilateral prominent cervical chain lymph nodes, measuring up to 1.6 cm short axis.  Normal caliber vasculature.  Unremarkable thyroid gland.  No acute osseous finding.  IMPRESSION: Tonsillar fullness narrows the oropharyngeal airway without peritonsillar abscess identified.  Prominent bilateral cervical chain lymph nodes.  May be reactive however recommend continued attention with physical exam and follow up imaging if warranted to document resolution.  Original Report Authenticated By: Waneta Martins, M.D.   Dg Chest Port 1 View  01/04/2012  *RADIOLOGY REPORT*  Clinical Data: Fever, sore throat.  PORTABLE CHEST - 1 VIEW  Comparison: None.  Findings: Lungs are clear. No pleural effusion or pneumothorax. The cardiomediastinal contours are within normal limits. The visualized bones and soft tissues are without significant appreciable abnormality.  IMPRESSION: No radiographic evidence of acute cardiopulmonary process.  Original Report Authenticated By: Waneta Martins, M.D.    Medications: I have reviewed the patient's current medications.  1-Pharyngitis: Continue with Unasyn day 2. MONO negative. Advance diet. WBC increase, patient on prednisone.   2-Fever, Leukocytosis: Probably related to Pharyngitis, probably bacterial infection, ANC at 13. No headaches, no neck stiffness. HIV negative.  Blood culture no growth to date, UA. Strept group A negative. GC/Chlamydia pending. LFT normal.   3-Acute renal failure: probably pre-renal. Improved with  IV fluids. Also patient received contrast  for CT neck. Cr decrease to 1. 2     LOS: 2 days   Agusta Hackenberg M.D.  [facility] 01/05/2012, 10:08 AM

## 2012-01-06 DIAGNOSIS — B9789 Other viral agents as the cause of diseases classified elsewhere: Secondary | ICD-10-CM

## 2012-01-06 DIAGNOSIS — B2790 Infectious mononucleosis, unspecified without complication: Secondary | ICD-10-CM

## 2012-01-06 DIAGNOSIS — R3129 Other microscopic hematuria: Secondary | ICD-10-CM

## 2012-01-06 DIAGNOSIS — J029 Acute pharyngitis, unspecified: Secondary | ICD-10-CM

## 2012-01-06 LAB — DIFFERENTIAL
Basophils Absolute: 0 10*3/uL (ref 0.0–0.1)
Basophils Relative: 0 % (ref 0–1)
Eosinophils Absolute: 0 10*3/uL (ref 0.0–0.7)
Monocytes Absolute: 2 10*3/uL — ABNORMAL HIGH (ref 0.1–1.0)
Monocytes Relative: 14 % — ABNORMAL HIGH (ref 3–12)
Neutrophils Relative %: 69 % (ref 43–77)

## 2012-01-06 LAB — CBC
HCT: 38.6 % — ABNORMAL LOW (ref 39.0–52.0)
Hemoglobin: 13.1 g/dL (ref 13.0–17.0)
MCH: 28.3 pg (ref 26.0–34.0)
MCHC: 33.9 g/dL (ref 30.0–36.0)
RDW: 14.2 % (ref 11.5–15.5)

## 2012-01-06 LAB — BASIC METABOLIC PANEL
BUN: 12 mg/dL (ref 6–23)
Creatinine, Ser: 1.2 mg/dL (ref 0.50–1.35)
GFR calc Af Amer: >90 mL/min (ref >90)
GFR calc non Af Amer: 79 mL/min — ABNORMAL LOW (ref >90)

## 2012-01-06 MED ORDER — TRAMADOL HCL 50 MG PO TABS: 50.0000 mg | ORAL_TABLET | Freq: Three times a day (TID) | ORAL | Status: DC | PRN

## 2012-01-06 MED ORDER — PREDNISONE 20 MG PO TABS
40.0000 mg | ORAL_TABLET | Freq: Every day | ORAL | Status: DC
  Filled 2012-01-06: qty 2

## 2012-01-06 MED ORDER — BACID PO TABS
2.0000 | ORAL_TABLET | Freq: Three times a day (TID) | ORAL | Status: DC
  Administered 2012-01-06 – 2012-01-07 (×5): 2 via ORAL
  Filled 2012-01-06 (×7): qty 2

## 2012-01-06 MED ORDER — DOXYCYCLINE HYCLATE 100 MG IV SOLR
100.0000 mg | Freq: Two times a day (BID) | INTRAVENOUS | Status: DC
  Administered 2012-01-06 (×2): 100 mg via INTRAVENOUS
  Filled 2012-01-06 (×4): qty 100

## 2012-01-06 MED ORDER — DEXAMETHASONE SODIUM PHOSPHATE 10 MG/ML IJ SOLN
10.0000 mg | Freq: Three times a day (TID) | INTRAMUSCULAR | Status: DC
  Administered 2012-01-06 – 2012-01-07 (×5): 10 mg via INTRAVENOUS
  Filled 2012-01-06 (×6): qty 1

## 2012-01-06 MED ORDER — OXYCODONE HCL 5 MG PO TABS
5.0000 mg | ORAL_TABLET | Freq: Once | ORAL | Status: AC
  Administered 2012-01-06: 5 mg via ORAL
  Filled 2012-01-06: qty 1

## 2012-01-06 MED ORDER — ACETAMINOPHEN 325 MG PO TABS
325.0000 mg | ORAL_TABLET | Freq: Once | ORAL | Status: AC
  Administered 2012-01-06: 325 mg via ORAL

## 2012-01-06 NOTE — Consult Note (Signed)
Zyron, Deeley [id number] [birth year] Alba Cory, MD  Reason for Consult: tonsillitis and tonsillar hypertrophy  HPI: 31 yo AAM admitted 2 days ago for sore throat and tonsillitis with tonsillar hypertrophy. Had Ct scan that I reviewed that shows prominent Waldeyer's ring tissue/tonsillar hypertrophy without abscess and some BL cervical adenopathy. Patient has been on PO prednisone and UNasyn but feels like his swallowing has not improved so ENT was consulted.  Allergies: No Known Allergies  ROS: Review of systems normal other than 12 systems except per HPI.  PMH: History reviewed. No pertinent past medical history.  FH: History reviewed. No pertinent family history.  SH:  History   Social History  . Marital Status: Single    Spouse Name: N/A    Number of Children: N/A  . Years of Education: N/A   Occupational History  . Not on file.   Social History Main Topics  . Smoking status: Current Everyday Smoker  . Smokeless tobacco: Not on file  . Alcohol Use: Yes  . Drug Use: No  . Sexually Active:    Other Topics Concern  . Not on file   Social History Narrative  . No narrative on file    PSH: History reviewed. No pertinent past surgical history.  Physical  Exam: CN 2-12 grossly intact and symmetric. EAC/TMs normal BL. Oral cavity with symmetric Friedman 3+ tonsils with midline uvula and no erythema or edema of the soft palate. Skin warm and dry. Nasal cavity without polyps or purulence. External nose and ears without masses or lesions. EOMI, PERRLA. Neck supple with no masses or lesions. Some mild BL symmetric lymphadenopathy palpated. Thyroid normal with no masses.   A/P: bilateral tonsillar hypertrophy with acute severe tonsillitis. I discussed the risks, benefits of tonsillectomy and tonsillectomy tomorrow vs. continuing IV steroids and antibiotics and outpatient tonsillectomy when he has recovered form this acute tonsillitis. He would prefer to continue with  medical management for now and consider outpatient tonsillectomy. I restarted his diet and switched his PO prednisone to IV decadron 10mg  Q8 hours for 2 days. I also added IV Doxycycline to the Unasyn. If he improves clinically he can be discharged on PO liquid Doxycycline and a liquid prednisolone taper and see me back at [facility] in 2-3 weeks to discuss outpatient adenotonsillectomy.   Melvenia Beam 01/06/2012 9:54 AM

## 2012-01-06 NOTE — Progress Notes (Addendum)
Subjective: Patient still complaining of throat pain, difficulty swallowing, feels swelling at times is worse. He feels he is not getting better.   Objective: Filed Vitals:   01/05/12 2200 01/06/12 0257 01/06/12 0530 01/06/12 0830  BP: 96/67 144/90 126/73   Pulse: 85 97 108   Temp: 98.2 F (36.8 C)  100.3 F (37.9 C) 100.3 F (37.9 C)  TempSrc:      Resp: 16 22 18    Height:      Weight:      SpO2: 100%  98%    Weight change:    General: Alert, awake, oriented x3, in no acute distress.  HEENT: No bruits, no goiter. Tonsillar enlargement, swelling whitish secreation.  Heart: Regular rate and rhythm, without murmurs, rubs, gallops.  Lungs: CTA, bilateral air movement.  Abdomen: Soft, nontender, nondistended, positive bowel sounds.  Neuro: Grossly intact, nonfocal. Extremities; no edema.   Lab Results:  [facility] 01/06/12 0545 01/05/12 0649  NA 137 138  K 3.6 4.4  CL 102 106  CO2 24 22  GLUCOSE 81 99  BUN 12 11  CREATININE 1.20 1.22  CALCIUM 9.1 9.1  MG -- --  PHOS -- --    Basename 01/04/12 0041  AST 23  ALT 35  ALKPHOS 93  BILITOT 0.3  PROT 9.0*  ALBUMIN 4.4    Basename 01/06/12 0545 01/05/12 0649 01/04/12 0041  WBC 14.8* 21.0* --  NEUTROABS 10.2* -- 13.9*  HGB 13.1 12.8* --  HCT 38.6* 38.0* --  MCV 83.4 83.3 --  PLT 241 173 --    Micro Results: Recent Results (from the past 240 hour(s))  RAPID STREP SCREEN     Status: Normal   Collection Time   01/03/12 11:31 PM      Component Value Range Status Comment   Streptococcus, Group A Screen (Direct) NEGATIVE  NEGATIVE Final   CULTURE, BLOOD (ROUTINE X 2)     Status: Normal (Preliminary result)   Collection Time   01/04/12 12:43 AM      Component Value Range Status Comment   Specimen Description BLOOD RIGHT ARM   Final    Special Requests BOTTLES DRAWN AEROBIC AND ANAEROBIC 5CC   Final    Culture  Setup Time [id number]   Final    Culture     Final    Value:        BLOOD CULTURE RECEIVED NO GROWTH TO  DATE CULTURE WILL BE HELD FOR 5 DAYS BEFORE ISSUING A FINAL NEGATIVE REPORT   Report Status PENDING   Incomplete   CULTURE, BLOOD (ROUTINE X 2)     Status: Normal (Preliminary result)   Collection Time   01/04/12 12:44 AM      Component Value Range Status Comment   Specimen Description BLOOD RIGHT ARM   Final    Special Requests BOTTLES DRAWN AEROBIC ONLY 5CC   Final    Culture  Setup Time [id number]   Final    Culture     Final    Value:        BLOOD CULTURE RECEIVED NO GROWTH TO DATE CULTURE WILL BE HELD FOR 5 DAYS BEFORE ISSUING A FINAL NEGATIVE REPORT   Report Status PENDING   Incomplete   URINE CULTURE     Status: Normal   Collection Time   01/04/12  1:44 AM      Component Value Range Status Comment   Specimen Description URINE, RANDOM   Final    Special Requests Normal  Final    Culture  Setup Time [id number]   Final    Colony Count NO GROWTH   Final    Culture NO GROWTH   Final    Report Status 01/05/2012 FINAL   Final   URINE CULTURE     Status: Normal   Collection Time   01/04/12 11:07 AM      Component Value Range Status Comment   Specimen Description URINE, CLEAN CATCH   Final    Special Requests Unasyn Normal   Final    Culture  Setup Time [id number]   Final    Colony Count NO GROWTH   Final    Culture NO GROWTH   Final    Report Status 01/05/2012 FINAL   Final   CULTURE, EXPECTORATED SPUTUM-ASSESSMENT     Status: Normal   Collection Time   01/04/12  1:03 PM      Component Value Range Status Comment   Specimen Description SPUTUM   Final    Special Requests NONE   Final    Sputum evaluation     Final    Value: MICROSCOPIC FINDINGS SUGGEST THAT THIS SPECIMEN IS NOT REPRESENTATIVE OF LOWER RESPIRATORY SECRETIONS. PLEASE RECOLLECT.     CALLED TO Christena Deem, RN 01/04/12 1345 BY K SCHULTZ   Report Status 01/04/2012 FINAL   Final     Studies/Results: No results found.  Medications: I have reviewed the patient's current  medications.  1-Pharyngitis/tonsillitis: Continue with Unasyn day 3. MONO negative. WBC decreasing. Will continue with prednisone.  Patient feels no improvement, still with dysphagia, feels swelling at time is worse. I consulted ENT for further evaluation. Will discontinue Lovenox for DVT prophylaxis.   2-Fever, Leukocytosis: Probably related to Pharyngitis, tonsillitis. probably bacterial infection, ANC at 13. No headaches, no neck stiffness. HIV negative. Blood culture no growth to date, UA. Strept group A negative. GC/Chlamydia pending. LFT normal.   3-Acute renal failure: probably pre-renal. Improved with IV fluids. Also patient received contrast for CT neck. Cr decrease to 1. 2. Resolved.      LOS: 3 days   Suman Trivedi M.D.  [facility] 01/06/2012, 9:21 AM

## 2012-01-07 DIAGNOSIS — J029 Acute pharyngitis, unspecified: Secondary | ICD-10-CM

## 2012-01-07 DIAGNOSIS — B9789 Other viral agents as the cause of diseases classified elsewhere: Secondary | ICD-10-CM

## 2012-01-07 DIAGNOSIS — R3129 Other microscopic hematuria: Secondary | ICD-10-CM

## 2012-01-07 LAB — BASIC METABOLIC PANEL
GFR calc Af Amer: >90 mL/min (ref >90)
GFR calc non Af Amer: >90 mL/min (ref >90)
Potassium: 5.1 mEq/L (ref 3.5–5.1)
Sodium: 137 mEq/L (ref 135–145)

## 2012-01-07 LAB — CBC
MCHC: 34 g/dL (ref 30.0–36.0)
Platelets: 283 10*3/uL (ref 150–400)
RDW: 14 % (ref 11.5–15.5)

## 2012-01-07 MED ORDER — DOXYCYCLINE HYCLATE 100 MG PO TABS
100.0000 mg | ORAL_TABLET | Freq: Two times a day (BID) | ORAL | Status: DC
  Administered 2012-01-07 (×2): 100 mg via ORAL
  Filled 2012-01-07 (×4): qty 1

## 2012-01-07 MED ORDER — RISAQUAD PO CAPS
2.0000 | ORAL_CAPSULE | Freq: Three times a day (TID) | ORAL | Status: DC
  Administered 2012-01-07 (×2): 2 via ORAL
  Filled 2012-01-07 (×5): qty 2

## 2012-01-07 NOTE — Progress Notes (Signed)
Subjective: Patient feeling better. I Suspect after IV steroids.  Feels able to swallow better.  Objective: Filed Vitals:   01/06/12 0830 01/06/12 1300 01/06/12 2130 01/07/12 0600  BP:  124/77 126/71 126/80  Pulse:  78 77 65  Temp: 100.3 F (37.9 C) 98.9 F (37.2 C) 98.8 F (37.1 C) 97.7 F (36.5 C)  TempSrc:      Resp:  18 16 16   Height:      Weight:      SpO2:  100% 98% 98%   Weight change:    General: Alert, awake, oriented x3, in no acute distress.  HEENT: No bruits, no goiter. Tonsillar enlargement.  Heart: Regular rate and rhythm, without murmurs, rubs, gallops.  Lungs: Crackles left side, bilateral air movement.  Abdomen: Soft, nontender, nondistended, positive bowel sounds.  Neuro: Grossly intact, nonfocal. Extremities; no edema.   Lab Results:  [facility] 01/07/12 0414 01/06/12 0545  NA 137 137  K 5.1 3.6  CL 104 102  CO2 23 24  GLUCOSE 115* 81  BUN 14 12  CREATININE 1.04 1.20  CALCIUM 9.6 9.1  MG -- --  PHOS -- --    Basename 01/07/12 0414 01/06/12 0545  WBC 16.7* 14.8*  NEUTROABS -- 10.2*  HGB 13.7 13.1  HCT 40.3 38.6*  MCV 82.6 83.4  PLT 283 241   Micro Results: Recent Results (from the past 240 hour(s))  RAPID STREP SCREEN     Status: Normal   Collection Time   01/03/12 11:31 PM      Component Value Range Status Comment   Streptococcus, Group A Screen (Direct) NEGATIVE  NEGATIVE Final   CULTURE, BLOOD (ROUTINE X 2)     Status: Normal (Preliminary result)   Collection Time   01/04/12 12:43 AM      Component Value Range Status Comment   Specimen Description BLOOD RIGHT ARM   Final    Special Requests BOTTLES DRAWN AEROBIC AND ANAEROBIC 5CC   Final    Culture  Setup Time [id number]   Final    Culture     Final    Value:        BLOOD CULTURE RECEIVED NO GROWTH TO DATE CULTURE WILL BE HELD FOR 5 DAYS BEFORE ISSUING A FINAL NEGATIVE REPORT   Report Status PENDING   Incomplete   CULTURE, BLOOD (ROUTINE X 2)     Status: Normal (Preliminary  result)   Collection Time   01/04/12 12:44 AM      Component Value Range Status Comment   Specimen Description BLOOD RIGHT ARM   Final    Special Requests BOTTLES DRAWN AEROBIC ONLY 5CC   Final    Culture  Setup Time [id number]   Final    Culture     Final    Value:        BLOOD CULTURE RECEIVED NO GROWTH TO DATE CULTURE WILL BE HELD FOR 5 DAYS BEFORE ISSUING A FINAL NEGATIVE REPORT   Report Status PENDING   Incomplete   URINE CULTURE     Status: Normal   Collection Time   01/04/12  1:44 AM      Component Value Range Status Comment   Specimen Description URINE, RANDOM   Final    Special Requests Normal   Final    Culture  Setup Time [id number]   Final    Colony Count NO GROWTH   Final    Culture NO GROWTH   Final    Report Status 01/05/2012 FINAL  Final   URINE CULTURE     Status: Normal   Collection Time   01/04/12 11:07 AM      Component Value Range Status Comment   Specimen Description URINE, CLEAN CATCH   Final    Special Requests Unasyn Normal   Final    Culture  Setup Time [id number]   Final    Colony Count NO GROWTH   Final    Culture NO GROWTH   Final    Report Status 01/05/2012 FINAL   Final   CULTURE, EXPECTORATED SPUTUM-ASSESSMENT     Status: Normal   Collection Time   01/04/12  1:03 PM      Component Value Range Status Comment   Specimen Description SPUTUM   Final    Special Requests NONE   Final    Sputum evaluation     Final    Value: MICROSCOPIC FINDINGS SUGGEST THAT THIS SPECIMEN IS NOT REPRESENTATIVE OF LOWER RESPIRATORY SECRETIONS. PLEASE RECOLLECT.     CALLED TO Christena Deem, RN 01/04/12 1345 BY K SCHULTZ   Report Status 01/04/2012 FINAL   Final     Studies/Results: No results found.  Medications: I have reviewed the patient's current medications.  1-Pharyngitis/tonsillitis: Patient received Unasyn for 3 days. I will start oral antibiotics, Doxycicline. MONO negative. WBC decreasing. Will continue with IV steroids. Appreciate Dr Emeline Darling help. Patient  does not want surgery at this time. He will follow up with Dr Emeline Darling outpatient. Day 4/10 of antibiotics.    2-Fever, Leukocytosis: Probably related to Pharyngitis, tonsillitis. probably bacterial infection, ANC at 13. No headaches, no neck stiffness. HIV negative. Blood culture no growth to date, UA. Strept group A negative. GC/Chlamydia pending. LFT normal.   3-Acute renal failure: probably pre-renal. Improved with IV fluids. Also patient received contrast for CT neck. Cr decrease to 1. 2. Resolved.   Will plan for discharge tomorrow, will continue with IV steroids today.      LOS: 4 days   Deldrick Linch M.D.  [facility] 01/07/2012, 8:23 AM

## 2012-01-07 NOTE — Progress Notes (Signed)
0800 Pt. Requested regular diet text to MD who requested to continue pt. On same diet no change at this time. Explained this to pt. Noted later that pt was eating food from [employer] pancakes, eggs and sausage. Reinstruction in re: to diet

## 2012-01-08 DIAGNOSIS — R3129 Other microscopic hematuria: Secondary | ICD-10-CM

## 2012-01-08 DIAGNOSIS — I10 Essential (primary) hypertension: Secondary | ICD-10-CM

## 2012-01-08 DIAGNOSIS — B9789 Other viral agents as the cause of diseases classified elsewhere: Secondary | ICD-10-CM

## 2012-01-08 DIAGNOSIS — J029 Acute pharyngitis, unspecified: Secondary | ICD-10-CM

## 2012-01-08 MED ORDER — TRAMADOL HCL 50 MG PO TABS: 50.0000 mg | ORAL_TABLET | Freq: Three times a day (TID) | ORAL | Status: AC | PRN

## 2012-01-08 MED ORDER — RISAQUAD PO CAPS: 2.0000 | ORAL_CAPSULE | Freq: Three times a day (TID) | ORAL | Status: DC

## 2012-01-08 MED ORDER — DOXYCYCLINE HYCLATE 100 MG PO TABS: 100.0000 mg | ORAL_TABLET | Freq: Two times a day (BID) | ORAL | Status: AC

## 2012-01-08 MED ORDER — PREDNISONE (PAK) 10 MG PO TABS: 20.0000 mg | ORAL_TABLET | Freq: Every day | ORAL | Status: AC

## 2012-01-08 NOTE — Discharge Summary (Signed)
Physician Discharge Summary  Alexander Franklin ZOX:[id number] DOB: 1981/03/10 DOA: 01/03/2012  PCP: Sheila Oats, MD  Admit date: 01/03/2012 Discharge date: 01/08/2012  Discharge Diagnoses:  Sever tonsillitis, Pharyngitis, acute Acute renal failure, resolved.  Dehydration, moderate    Discharge Condition: improved.   Disposition:  Follow-up Information    Follow up with Melvenia Beam, MD. Call in 1 week.   Contact information:   [facility] [address], [address] [place] [address] [phone]          Diet: Parke Simmers  History of present illness:  Alexander Franklin is an 31 y.o. male with benign past medical history on no chronic medication presents to emergency room with three-day history of sore throat, odynophagia, fever, chills, and one-day history of loose stool. He denied shortness of breath, cough, abdominal cramps or pain, nausea or vomiting, dysuria or polyuria. He has not been out of the country, denied any drug use, denied any increased risk for STD. His girlfriend had upper respiratory symptoms one week prior and was placed on Biaxin. Evaluation in emergency room included a white count of 19.5 thousand with atypical lymphocytes. He has elevated creatinine to 1.8, serum sodium 133, and normal liver function tests. Rapid strep is negative. His CT scan of the neck shows enlarged tonsils narrowing its space, but no abcesses. Urinalysis is positive for 7-10 RBC, rare bacteria and 0-2 wbc. Hospitalist was asked to admit patient for renal insufficiency, and further evaluation pharyngitis.   Hospital Course:   1-Pharyngitis/Severe tonsillitis: Patient presented with sore throat, fever, dysphagia. CT showed tonsillitis, no abscess.  Patient received Unasyn for 3 days. He was also started on prednisone. Over course hospitalization , dysphagia was getting worse. ENT was consulted, Dr Emeline Darling recommend IV decadron and doxycycline. Patient  improved clinically, remain afebrile. He was started  oral antibiotics, Doxycicline. MONO negative. WBC decreasing.  Appreciate Dr Emeline Darling help. Patient does not want surgery at this time. He will follow up with Dr Emeline Darling outpatient. Day 5/12 of antibiotics. Will discharge on taper prednisone.   2-Fever, Leukocytosis: Probably related to Pharyngitis, tonsillitis. probably bacterial infection, ANC at 13. No headaches, no neck stiffness. HIV negative. Blood culture no growth to date, UA. Strept group A negative. GC/Chlamydia negative. LFT normal.   3-Acute renal failure: probably pre-renal. Improved with IV fluids. Also patient received contrast for CT neck. Cr decrease to 1. 2. Resolved.    Discharge Exam: Filed Vitals:   01/08/12 0600  BP: 138/74  Pulse: 63  Temp: 97.5 F (36.4 C)  Resp: 17   Filed Vitals:   01/07/12 0600 01/07/12 1400 01/07/12 2100 01/08/12 0600  BP: 126/80 125/75 141/86 138/74  Pulse: 65 67 77 63  Temp: 97.7 F (36.5 C) 98 F (36.7 C) 98 F (36.7 C) 97.5 F (36.4 C)  TempSrc:      Resp: 16 18 18 17   Height:      Weight:      SpO2: 98% 97% 98% 100%   General: No distress Cardiovascular: S1, S2 RRR Respiratory: CTA  Discharge Instructions  Discharge Orders    Future Orders Please Complete By Expires   Diet general      Scheduling Instructions:   Parke Simmers diet   Increase activity slowly        Medication List  As of 01/08/2012  7:48 AM   STOP taking these medications         NYQUIL PO      THERAFLU COLD &  COUGH PO         TAKE these medications         acidophilus Caps   Take 2 capsules by mouth 3 (three) times daily.      doxycycline 100 MG tablet   Commonly known as: VIBRA-TABS   Take 1 tablet (100 mg total) by mouth every 12 (twelve) hours.      predniSONE 10 MG tablet   Commonly known as: STERAPRED UNI-PAK   Take 2 tablets (20 mg total) by mouth daily. Take 2 tablet once a day for 2 days then 1 tablet once a day for 2 days then 1 tablet once  a day for 1 day then stop.      traMADol 50 MG tablet   Commonly known as: ULTRAM   Take 1 tablet (50 mg total) by mouth every 8 (eight) hours as needed.              The results of significant diagnostics from this hospitalization (including imaging, microbiology, ancillary and laboratory) are listed below for reference.    Significant Diagnostic Studies: Dg Neck Soft Tissue  01/04/2012  *RADIOLOGY REPORT*  Clinical Data: Fever and sore throat for 3 days.  NECK SOFT TISSUES - 1+ VIEW  Comparison: None.  Findings: Prevertebral soft tissues are within normal limits. Epiglottis within normal limits.  Aryepiglottic folds not thickened.  Soft tissue fullness at the region of the piriform sinuses is favored to be due to underdistension.  No airway impingement.  IMPRESSION: No acute findings.  Original Report Authenticated By: Consuello Bossier, M.D.   Ct Soft Tissue Neck W Contrast  01/04/2012  *RADIOLOGY REPORT*  Clinical Data: Fever, sore throat.  CT NECK WITH CONTRAST  Technique:  Multidetector CT imaging of the neck was performed with intravenous contrast.  Contrast: 50mL OMNIPAQUE IOHEXOL 300 MG/ML  SOLN  Comparison: 01/04/2012 radiograph  Findings: Lung apices are clear.  Limited images through the posterior fossa/lower temporal lobes show no acute intracranial finding.  Partially opacified right maxillary sinus may represent a polyp. A small polyp versus mucosal thickening within each sphenoid chamber. Visualized paranasal sinuses and mastoid air cells are otherwise predominately clear.  Unremarkable nasal cavity and nasopharynx.  Unremarkable oral cavity.  Bilateral tonsillar fullness narrows the oropharyngeal airway however, no peritonsillar abscess identified.  Unremarkable hypopharynx and epiglottis.  Unremarkable larynx.  Symmetric parotid and submandibular glands.  There are bilateral prominent cervical chain lymph nodes, measuring up to 1.6 cm short axis.  Normal caliber vasculature.   Unremarkable thyroid gland.  No acute osseous finding.  IMPRESSION: Tonsillar fullness narrows the oropharyngeal airway without peritonsillar abscess identified.  Prominent bilateral cervical chain lymph nodes.  May be reactive however recommend continued attention with physical exam and follow up imaging if warranted to document resolution.  Original Report Authenticated By: Waneta Martins, M.D.   Dg Chest Port 1 View  01/04/2012  *RADIOLOGY REPORT*  Clinical Data: Fever, sore throat.  PORTABLE CHEST - 1 VIEW  Comparison: None.  Findings: Lungs are clear. No pleural effusion or pneumothorax. The cardiomediastinal contours are within normal limits. The visualized bones and soft tissues are without significant appreciable abnormality.  IMPRESSION: No radiographic evidence of acute cardiopulmonary process.  Original Report Authenticated By: Waneta Martins, M.D.    Microbiology: Recent Results (from the past 240 hour(s))  RAPID STREP SCREEN     Status: Normal   Collection Time   01/03/12 11:31 PM      Component Value Range  Status Comment   Streptococcus, Group A Screen (Direct) NEGATIVE  NEGATIVE Final   CULTURE, BLOOD (ROUTINE X 2)     Status: Normal (Preliminary result)   Collection Time   01/04/12 12:43 AM      Component Value Range Status Comment   Specimen Description BLOOD RIGHT ARM   Final    Special Requests BOTTLES DRAWN AEROBIC AND ANAEROBIC 5CC   Final    Culture  Setup Time [id number]   Final    Culture     Final    Value:        BLOOD CULTURE RECEIVED NO GROWTH TO DATE CULTURE WILL BE HELD FOR 5 DAYS BEFORE ISSUING A FINAL NEGATIVE REPORT   Report Status PENDING   Incomplete   CULTURE, BLOOD (ROUTINE X 2)     Status: Normal (Preliminary result)   Collection Time   01/04/12 12:44 AM      Component Value Range Status Comment   Specimen Description BLOOD RIGHT ARM   Final    Special Requests BOTTLES DRAWN AEROBIC ONLY 5CC   Final    Culture  Setup Time [id number]   Final     Culture     Final    Value:        BLOOD CULTURE RECEIVED NO GROWTH TO DATE CULTURE WILL BE HELD FOR 5 DAYS BEFORE ISSUING A FINAL NEGATIVE REPORT   Report Status PENDING   Incomplete   URINE CULTURE     Status: Normal   Collection Time   01/04/12  1:44 AM      Component Value Range Status Comment   Specimen Description URINE, RANDOM   Final    Special Requests Normal   Final    Culture  Setup Time [id number]   Final    Colony Count NO GROWTH   Final    Culture NO GROWTH   Final    Report Status 01/05/2012 FINAL   Final   URINE CULTURE     Status: Normal   Collection Time   01/04/12 11:07 AM      Component Value Range Status Comment   Specimen Description URINE, CLEAN CATCH   Final    Special Requests Unasyn Normal   Final    Culture  Setup Time [id number]   Final    Colony Count NO GROWTH   Final    Culture NO GROWTH   Final    Report Status 01/05/2012 FINAL   Final   CULTURE, EXPECTORATED SPUTUM-ASSESSMENT     Status: Normal   Collection Time   01/04/12  1:03 PM      Component Value Range Status Comment   Specimen Description SPUTUM   Final    Special Requests NONE   Final    Sputum evaluation     Final    Value: MICROSCOPIC FINDINGS SUGGEST THAT THIS SPECIMEN IS NOT REPRESENTATIVE OF LOWER RESPIRATORY SECRETIONS. PLEASE RECOLLECT.     CALLED TO Christena Deem, RN 01/04/12 1345 BY K SCHULTZ   Report Status 01/04/2012 FINAL   Final      Labs: Basic Metabolic Panel:  Lab 01/07/12 6440 01/06/12 0545 01/05/12 0649 01/04/12 0103 01/04/12 0041  NA 137 137 138 136 133*  K 5.1 3.6 -- -- --  CL 104 102 106 102 97  CO2 23 24 22  -- 21  GLUCOSE 115* 81 99 115* 113*  BUN 14 12 11 13 13   CREATININE 1.04 1.20 1.22 2.00* 1.80*  CALCIUM 9.6 9.1 9.1 --  10.2  MG -- -- -- -- --  PHOS -- -- -- -- --   Liver Function Tests:  Lab 01/04/12 0041  AST 23  ALT 35  ALKPHOS 93  BILITOT 0.3  PROT 9.0*  ALBUMIN 4.4   CBC:  Lab 01/07/12 0414 01/06/12 0545 01/05/12 0649 01/04/12  0103 01/04/12 0041  WBC 16.7* 14.8* 21.0* -- 19.5*  NEUTROABS -- 10.2* -- -- 13.9*  HGB 13.7 13.1 12.8* 17.3* 16.0  HCT 40.3 38.6* 38.0* 51.0 47.2  MCV 82.6 83.4 83.3 -- 84.1  PLT 283 241 173 -- 242   Time coordinating discharge: 35 minutes.   SignedHartley Barefoot  [facility] 01/08/2012, 7:48 AM

## 2012-01-10 LAB — CULTURE, BLOOD (ROUTINE X 2)
Culture  Setup Time: 201306130810
Culture: NO GROWTH

## 2012-03-14 ENCOUNTER — Encounter: Payer: Self-pay | Admitting: Family Medicine

## 2012-03-14 ENCOUNTER — Ambulatory Visit (INDEPENDENT_AMBULATORY_CARE_PROVIDER_SITE_OTHER): Payer: 59 | Admitting: Family Medicine

## 2012-03-14 VITALS — BP 120/82 | HR 135 | Temp 102.0°F | Ht 69.5 in | Wt 217.4 lb

## 2012-03-14 DIAGNOSIS — J039 Acute tonsillitis, unspecified: Secondary | ICD-10-CM

## 2012-03-14 DIAGNOSIS — J029 Acute pharyngitis, unspecified: Secondary | ICD-10-CM

## 2012-03-14 LAB — BASIC METABOLIC PANEL
CO2: 24 mEq/L (ref 19–32)
Glucose, Bld: 102 mg/dL — ABNORMAL HIGH (ref 70–99)
Potassium: 3.8 mEq/L (ref 3.5–5.1)
Sodium: 132 mEq/L — ABNORMAL LOW (ref 135–145)

## 2012-03-14 LAB — HEPATIC FUNCTION PANEL
AST: 22 U/L (ref 0–37)
Albumin: 4.5 g/dL (ref 3.5–5.2)
Alkaline Phosphatase: 72 U/L (ref 39–117)
Total Protein: 8.9 g/dL — ABNORMAL HIGH (ref 6.0–8.3)

## 2012-03-14 LAB — CBC WITH DIFFERENTIAL/PLATELET
Eosinophils Relative: 0.1 % (ref 0.0–5.0)
HCT: 45.8 % (ref 39.0–52.0)
Monocytes Relative: 16.6 % — ABNORMAL HIGH (ref 3.0–12.0)
Neutrophils Relative %: 66.2 % (ref 43.0–77.0)
Platelets: 262 10*3/uL (ref 150.0–400.0)
WBC: 14.2 10*3/uL — ABNORMAL HIGH (ref 4.5–10.5)

## 2012-03-14 LAB — POCT RAPID STREP A (OFFICE): Rapid Strep A Screen: NEGATIVE

## 2012-03-14 MED ORDER — PREDNISONE 10 MG PO TABS: ORAL_TABLET | ORAL | Status: DC

## 2012-03-14 MED ORDER — CEFUROXIME AXETIL 500 MG PO TABS: 500.0000 mg | ORAL_TABLET | Freq: Two times a day (BID) | ORAL | Status: AC

## 2012-03-14 NOTE — Progress Notes (Signed)
  Subjective:     Alexander Franklin is a 31 y.o. male who presents for evaluation of sore throat. Associated symptoms include fevers up to 102.7 degrees, sore throat, swollen glands and white spots in throat. Onset of symptoms was 2 months ago, and have been unchanged since that time. He is drinking plenty of fluids. He has not had a recent close exposure to someone with proven streptococcal pharyngitis.  Pt was seen in ER in June and mono and strep were neg.  WBC  Has been high for months.       The following portions of the patient's history were reviewed and updated as appropriate: allergies, current medications, past family history, past medical history, past social history, past surgical history and problem list.  Review of Systems Pertinent items are noted in HPI.    Objective:    BP 120/82  Pulse 135  Temp 102 F (38.9 C) (Oral)  Ht 5' 9.5" (1.765 m)  Wt 217 lb 6.4 oz (98.612 kg)  BMI 31.64 kg/m2  SpO2 97% General appearance: alert, cooperative, appears stated age and mild distress Ears: normal TM's and external ear canals both ears Nose: Nares normal. Septum midline. Mucosa normal. No drainage or sinus tenderness. Throat: abnormal findings: exudates present, marked oropharyngeal erythema and tonsillar hypertrophy touching at midline Neck: moderate anterior cervical adenopathy, supple, symmetrical, trachea midline and thyroid not enlarged, symmetric, no tenderness/mass/nodules Lungs: clear to auscultation bilaterally Lymph nodes: Cervical adenopathy: b/l  Laboratory Strep test done. Results:negative.    Assessment:    Acute pharyngitis, likely  Bacterial tonsillitis R/O strep.    Plan:    Patient placed on antibiotics. Use of OTC analgesics recommended as well as salt water gargles. Patient advised of the risk of peritonsillar abscess formation. check labs,  throat culture done  Refer to ENT today

## 2012-03-14 NOTE — Patient Instructions (Signed)
Tonsillitis Tonsils are lumps of lymphoid tissues at the back of the throat. Each tonsil has 20 crevices (crypts). Tonsils help fight nose and throat infections and keep infection from spreading to other parts of the body for the first 18 months of life. Tonsillitis is an infection of the throat that causes the tonsils to become red, tender, and swollen. CAUSES Sudden and, if treated, temporary (acute) tonsillitis is usually caused by infection with streptococcal bacteria. Long lasting (chronic) tonsillitis occurs when the crypts of the tonsils become filled with pieces of food and bacteria, which makes it easy for the tonsils to become constantly infected. SYMPTOMS  Symptoms of tonsillitis include:  A sore throat.   White patches on the tonsils.   Fever.   Tiredness.  DIAGNOSIS Tonsillitis can be diagnosed through a physical exam. Diagnosis can be confirmed with the results of lab tests, including a throat culture. TREATMENT  The goals of tonsillitis treatment include the reduction of the severity and duration of symptoms, prevention of associated conditions, and prevention of disease transmission. Tonsillitis caused by bacteria can be treated with antibiotics. Usually, treatment with antibiotics is started before the cause of the tonsillitis is known. However, if it is determined that the cause is not bacterial, antibiotics will not treat the tonsillitis. If attacks of tonsillitis are severe and frequent, your caregiver may recommend surgery to remove the tonsils (tonsillectomy). HOME CARE INSTRUCTIONS   Rest as much as possible and get plenty of sleep.   Drink plenty of fluids. While the throat is very sore, eat soft foods or liquids, such as sherbet, soups, or instant breakfast drinks.   Eat frozen ice pops.   Older children and adults may gargle with a warm or cold liquid to help soothe the throat. Mix 1 teaspoon of salt in 1 cup of water.   Other family members who also develop a  sore throat or fever should have a medical exam or throat culture.   Only take over-the-counter or prescription medicines for pain, discomfort, or fever as directed by your caregiver.   If you are given antibiotics, take them as directed. Finish them even if you start to feel better.  SEEK MEDICAL CARE IF:   Your baby is older than 3 months with a rectal temperature of 100.5 F (38.1 C) or higher for more than 1 day.   Large, tender lumps develop in your neck.   A rash develops.   Green, yellow-brown, or bloody substance is coughed up.   You are unable to swallow liquids or food for 24 hours.   Your child is unable to swallow food or liquids for 12 hours.  SEEK IMMEDIATE MEDICAL CARE IF:   You develop any new symptoms such as vomiting, severe headache, stiff neck, chest pain, or trouble breathing or swallowing.   You have severe throat pain along with drooling or voice changes.   You have severe pain, unrelieved with recommended medications.   You are unable to fully open the mouth.   You develop redness, swelling, or severe pain anywhere in the neck.   You have a fever.   Your baby is older than 3 months with a rectal temperature of 102 F (38.9 C) or higher.   Your baby is 12 months old or younger with a rectal temperature of 100.4 F (38 C) or higher.  MAKE SURE YOU:   Understand these instructions.   Will watch your condition.   Will get help right away if you are not  watch your condition.   Will get help right away if you are not doing well or get worse.  Document Released: 04/19/2005 Document Revised: 06/29/2011 Document Reviewed: 09/15/2010  [employer] Patient Information 2012 [employer]

## 2012-03-24 DIAGNOSIS — J3501 Chronic tonsillitis: Secondary | ICD-10-CM

## 2012-03-24 HISTORY — DX: Chronic tonsillitis: J35.01

## 2012-03-26 ENCOUNTER — Encounter (HOSPITAL_BASED_OUTPATIENT_CLINIC_OR_DEPARTMENT_OTHER): Payer: Self-pay | Admitting: *Deleted

## 2012-03-29 ENCOUNTER — Encounter (HOSPITAL_BASED_OUTPATIENT_CLINIC_OR_DEPARTMENT_OTHER): Payer: Self-pay | Admitting: *Deleted

## 2012-03-29 ENCOUNTER — Ambulatory Visit (HOSPITAL_BASED_OUTPATIENT_CLINIC_OR_DEPARTMENT_OTHER)
Admission: RE | Admit: 2012-03-29 | Discharge: 2012-03-29 | Disposition: A | Discharge status: Home / Self Care | Payer: 59 | Source: Ambulatory Visit | Attending: Otolaryngology | Admitting: Otolaryngology

## 2012-03-29 ENCOUNTER — Ambulatory Visit (HOSPITAL_BASED_OUTPATIENT_CLINIC_OR_DEPARTMENT_OTHER): Payer: 59 | Admitting: *Deleted

## 2012-03-29 ENCOUNTER — Encounter (HOSPITAL_BASED_OUTPATIENT_CLINIC_OR_DEPARTMENT_OTHER): Payer: Self-pay

## 2012-03-29 ENCOUNTER — Encounter (HOSPITAL_BASED_OUTPATIENT_CLINIC_OR_DEPARTMENT_OTHER): Payer: Self-pay | Admitting: Anesthesiology

## 2012-03-29 ENCOUNTER — Encounter (HOSPITAL_BASED_OUTPATIENT_CLINIC_OR_DEPARTMENT_OTHER)
Admission: RE | Disposition: A | Discharge status: Home / Self Care | Payer: Self-pay | Source: Ambulatory Visit | Attending: Otolaryngology

## 2012-03-29 DIAGNOSIS — F17200 Nicotine dependence, unspecified, uncomplicated: Secondary | ICD-10-CM | POA: Insufficient documentation

## 2012-03-29 DIAGNOSIS — J039 Acute tonsillitis, unspecified: Secondary | ICD-10-CM

## 2012-03-29 DIAGNOSIS — J3501 Chronic tonsillitis: Secondary | ICD-10-CM | POA: Insufficient documentation

## 2012-03-29 HISTORY — DX: Chronic tonsillitis: J35.01

## 2012-03-29 HISTORY — PX: TONSILLECTOMY: SHX5217

## 2012-03-29 HISTORY — DX: Depression, unspecified: F32.A

## 2012-03-29 HISTORY — DX: Major depressive disorder, single episode, unspecified: F32.9

## 2012-03-29 SURGERY — TONSILLECTOMY
Anesthesia: General | Site: Mouth | Wound class: Clean Contaminated

## 2012-03-29 MED ORDER — FENTANYL CITRATE 0.05 MG/ML IJ SOLN
INTRAMUSCULAR | Status: DC | PRN
  Administered 2012-03-29: 25 ug via INTRAVENOUS
  Administered 2012-03-29: 100 ug via INTRAVENOUS
  Administered 2012-03-29: 25 ug via INTRAVENOUS
  Administered 2012-03-29: 50 ug via INTRAVENOUS

## 2012-03-29 MED ORDER — HYDROCODONE-ACETAMINOPHEN 7.5-500 MG/15ML PO SOLN: 15.0000 mL | Freq: Four times a day (QID) | ORAL | Status: AC | PRN

## 2012-03-29 MED ORDER — PROMETHAZINE HCL 25 MG RE SUPP: 25.0000 mg | Freq: Four times a day (QID) | RECTAL | Status: DC | PRN

## 2012-03-29 MED ORDER — DEXAMETHASONE SODIUM PHOSPHATE 4 MG/ML IJ SOLN
INTRAMUSCULAR | Status: DC | PRN
  Administered 2012-03-29: 10 mg via INTRAVENOUS

## 2012-03-29 MED ORDER — MIDAZOLAM HCL 5 MG/5ML IJ SOLN
INTRAMUSCULAR | Status: DC | PRN
  Administered 2012-03-29: 2 mg via INTRAVENOUS

## 2012-03-29 MED ORDER — HYDROMORPHONE HCL PF 1 MG/ML IJ SOLN
0.2500 mg | INTRAMUSCULAR | Status: DC | PRN
  Administered 2012-03-29 (×2): 0.5 mg via INTRAVENOUS

## 2012-03-29 MED ORDER — FENTANYL CITRATE 0.05 MG/ML IJ SOLN: 50.0000 ug | INTRAMUSCULAR | Status: DC | PRN

## 2012-03-29 MED ORDER — HYDROCODONE-ACETAMINOPHEN 7.5-500 MG/15ML PO SOLN
10.0000 mL | ORAL | Status: DC | PRN
  Administered 2012-03-29: 15 mL via ORAL

## 2012-03-29 MED ORDER — LIDOCAINE HCL (CARDIAC) 20 MG/ML IV SOLN
INTRAVENOUS | Status: DC | PRN
  Administered 2012-03-29: 100 mg via INTRAVENOUS

## 2012-03-29 MED ORDER — IBUPROFEN 100 MG/5ML PO SUSP: 400.0000 mg | Freq: Four times a day (QID) | ORAL | Status: DC | PRN

## 2012-03-29 MED ORDER — MIDAZOLAM HCL 2 MG/2ML IJ SOLN: 1.0000 mg | INTRAMUSCULAR | Status: DC | PRN

## 2012-03-29 MED ORDER — GLYCOPYRROLATE 0.2 MG/ML IJ SOLN
INTRAMUSCULAR | Status: DC | PRN
  Administered 2012-03-29: 0.2 mg via INTRAVENOUS

## 2012-03-29 MED ORDER — ONDANSETRON HCL 4 MG/2ML IJ SOLN
INTRAMUSCULAR | Status: DC | PRN
  Administered 2012-03-29: 4 mg via INTRAVENOUS

## 2012-03-29 MED ORDER — ACETAMINOPHEN 10 MG/ML IV SOLN
INTRAVENOUS | Status: DC | PRN
  Administered 2012-03-29: 1000 mg via INTRAVENOUS

## 2012-03-29 MED ORDER — DEXTROSE-NACL 5-0.9 % IV SOLN: INTRAVENOUS | Status: DC

## 2012-03-29 MED ORDER — LACTATED RINGERS IV SOLN
INTRAVENOUS | Status: DC
  Administered 2012-03-29 (×2): via INTRAVENOUS

## 2012-03-29 MED ORDER — PROPOFOL 10 MG/ML IV BOLUS
INTRAVENOUS | Status: DC | PRN
  Administered 2012-03-29: 200 mg via INTRAVENOUS

## 2012-03-29 MED ORDER — PROMETHAZINE HCL 25 MG PO TABS: 25.0000 mg | ORAL_TABLET | Freq: Four times a day (QID) | ORAL | Status: DC | PRN

## 2012-03-29 MED ORDER — PHENOL 1.4 % MT LIQD: 1.0000 | OROMUCOSAL | Status: DC | PRN

## 2012-03-29 MED ORDER — SUCCINYLCHOLINE CHLORIDE 20 MG/ML IJ SOLN
INTRAMUSCULAR | Status: DC | PRN
  Administered 2012-03-29: 100 mg via INTRAVENOUS

## 2012-03-29 MED ORDER — PROMETHAZINE HCL 25 MG/ML IJ SOLN: 6.2500 mg | INTRAMUSCULAR | Status: DC | PRN

## 2012-03-29 MED ORDER — 0.9 % SODIUM CHLORIDE (POUR BTL) OPTIME
TOPICAL | Status: DC | PRN
  Administered 2012-03-29: 100 mL

## 2012-03-29 SURGICAL SUPPLY — 29 items
CANISTER SUCTION 1200CC (MISCELLANEOUS) ×2 IMPLANT
CATH ROBINSON RED A/P 12FR (CATHETERS) ×2 IMPLANT
CLOTH BEACON ORANGE TIMEOUT ST (SAFETY) ×2 IMPLANT
COAGULATOR SUCT SWTCH 10FR 6 (ELECTROSURGICAL) ×2 IMPLANT
COVER MAYO STAND STRL (DRAPES) ×2 IMPLANT
ELECT COATED BLADE 2.86 ST (ELECTRODE) ×2 IMPLANT
ELECT REM PT RETURN 9FT ADLT (ELECTROSURGICAL) ×2
ELECT REM PT RETURN 9FT PED (ELECTROSURGICAL)
ELECTRODE REM PT RETRN 9FT PED (ELECTROSURGICAL) IMPLANT
ELECTRODE REM PT RTRN 9FT ADLT (ELECTROSURGICAL) ×1 IMPLANT
GAUZE SPONGE 4X4 12PLY STRL LF (GAUZE/BANDAGES/DRESSINGS) ×2 IMPLANT
GLOVE BIO SURGEON STRL SZ 6.5 (GLOVE) ×2 IMPLANT
GLOVE ECLIPSE 7.5 STRL STRAW (GLOVE) ×2 IMPLANT
GLOVE SKINSENSE NS SZ7.0 (GLOVE) ×1
GLOVE SKINSENSE STRL SZ7.0 (GLOVE) ×1 IMPLANT
GOWN PREVENTION PLUS XLARGE (GOWN DISPOSABLE) ×6 IMPLANT
MARKER SKIN DUAL TIP RULER LAB (MISCELLANEOUS) IMPLANT
NS IRRIG 1000ML POUR BTL (IV SOLUTION) ×2 IMPLANT
PENCIL FOOT CONTROL (ELECTRODE) ×2 IMPLANT
SHEET MEDIUM DRAPE 40X70 STRL (DRAPES) ×2 IMPLANT
SOLUTION BUTLER CLEAR DIP (MISCELLANEOUS) IMPLANT
SPONGE TONSIL 1 RF SGL (DISPOSABLE) IMPLANT
SPONGE TONSIL 1.25 RF SGL STRG (GAUZE/BANDAGES/DRESSINGS) IMPLANT
SYR BULB 3OZ (MISCELLANEOUS) ×2 IMPLANT
TOWEL OR 17X24 6PK STRL BLUE (TOWEL DISPOSABLE) ×2 IMPLANT
TUBE CONNECTING 20X1/4 (TUBING) ×2 IMPLANT
TUBE SALEM SUMP 12R W/ARV (TUBING) IMPLANT
TUBE SALEM SUMP 16 FR W/ARV (TUBING) ×2 IMPLANT
WATER STERILE IRR 1000ML POUR (IV SOLUTION) ×2 IMPLANT

## 2012-03-29 NOTE — Anesthesia Postprocedure Evaluation (Signed)
  Anesthesia Post-op Note  Patient: Alexander Franklin  Procedure(s) Performed: Procedure(s) (LRB) with comments: TONSILLECTOMY (N/A)  Patient Location: PACU  Anesthesia Type: General  Level of Consciousness: awake and alert   Airway and Oxygen Therapy: Patient Spontanous Breathing  Post-op Pain: mild  Post-op Assessment: Post-op Vital signs reviewed, Patient's Cardiovascular Status Stable, Respiratory Function Stable, Patent Airway, No signs of Nausea or vomiting and Pain level controlled  Post-op Vital Signs: stable  Complications: No apparent anesthesia complications

## 2012-03-29 NOTE — Anesthesia Preprocedure Evaluation (Addendum)
Anesthesia Evaluation  Patient identified by MRN, date of birth, ID band Patient awake    Reviewed: Allergy & Precautions, H&P , NPO status , Patient's Chart, lab work & pertinent test results  Airway Mallampati: II TM Distance: >3 FB Neck ROM: Full    Dental   Pulmonary Current Smoker,  + rhonchi         Cardiovascular Rhythm:Regular Rate:Normal     Neuro/Psych Depression    GI/Hepatic   Endo/Other    Renal/GU      Musculoskeletal   Abdominal   Peds  Hematology   Anesthesia Other Findings   Reproductive/Obstetrics                           Anesthesia Physical Anesthesia Plan  ASA: II  Anesthesia Plan:    Post-op Pain Management:    Induction:   Airway Management Planned:   Additional Equipment:   Intra-op Plan:   Post-operative Plan:   Informed Consent:   Plan Discussed with:   Anesthesia Plan Comments:         Anesthesia Quick Evaluation

## 2012-03-29 NOTE — Transfer of Care (Signed)
Immediate Anesthesia Transfer of Care Note  Patient: Alexander Franklin  Procedure(s) Performed: Procedure(s) (LRB) with comments: TONSILLECTOMY (N/A)  Patient Location: PACU  Anesthesia Type: General  Level of Consciousness: awake, alert  and oriented  Airway & Oxygen Therapy: Patient Spontanous Breathing and Patient connected to face mask oxygen  Post-op Assessment: Report given to PACU RN and Post -op Vital signs reviewed and stable  Post vital signs: Reviewed and stable  Complications: No apparent anesthesia complications

## 2012-03-29 NOTE — H&P (Signed)
Assessment  . 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 Acute tonsillitis   (463) Orders  Hydrocodone-Acetaminophen 7.5-500 MG/15ML Oral Solution;TAKE 15 ML EVERY 4 TO 6 HOURS AS NEEDED FOR PAIN; Qty240; R0; Rx. Discussed  Second episode of acute tonsillitis in two months. Rapid strep was negative today. Will await culture results and prescribe antibiotics accordingly. We will check on this in the next few days. Infection could be viral in which case antibiotics will not help. Prescribed Lortab elixir for pain; he may alternate this with motrin and advised that he stay well hydrated. If sore throat worsens, contact our office. 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 Should he elect audiometric testing in the future, we are happy to accomodate. Noise precautions advised. We will see him back as needed, patient agrees with the plans. Plan  I have interviewed and examined the patient and developed the proposed treatment plan.  Alexander Franklin [name], M.D. Reason For Visit  Alexander Franklin is here today at the kind request of Alexander Franklin for consultation and opinion. Patient is here for sore throat for 4 days. HPI  Patient is a 31 year old male who presents for sore throat. He was hospitalized June 2013 for severe acute tonsillitis with bilateral tonsillar hypertrophy. This was his first episode of tonsillitis, he did well following discharge. Four days ago he developed a sore throat with a fever of 102. Pain is bilateral. He is trying to drink plenty of fluids and is voiding a few times per day. Earlier today he saw Alexander Franklin, rapid strep was negative and throat culture performed. He has been taking Tylenol for pain relief. Prescribed Ceftin and prednisone which has not been filled yet.  Patient also reports right sided pulsatile tinnitus for about 10 years; it is unchanged from when it started. He can put pressure on the  right jugular vein and tinnitus stops and resumes when hand is removed. Noise exposures include gunfire during [employer]. Hearing is worse in the right ear compared to the left. Allergies  No Known Drug Allergies. Current Meds  Acetaminophen 500 MG Oral Tablet;; RPT. Active Problems  Rhythm Disorder (427.9). Personal Hx  Being A Social Drinker Current Every Day Smoker (305.1). ROS  Systemic: Feeling tired (fatigue), fever, and night sweats.  No recent weight loss. Head: No headache. Eyes: No eye symptoms. Otolaryngeal: No hearing loss, no earache, the ears do not feel pressured , stopped up, the ears do not feel full, no tinnitus, and no purulent nasal discharge.  No nasal passage blockage (stuffiness), no snoring, no sneezing, no hoarseness, and no sore throat. Cardiovascular: No chest pain or discomfort  and no palpitations. Pulmonary: No dyspnea.  Cough.  No wheezing. Gastrointestinal: Dysphagia.  No heartburn.  No nausea, no abdominal pain, and no melena.  No diarrhea. Genitourinary: No dysuria. Endocrine: No muscle weakness. Musculoskeletal: No arthralgias. Neurological: Dizziness.  No fainting  and no numbness. Psychological: No anxiety.  Depression. Skin: No rash. 12 system ROS was obtained and reviewed on the Health Maintenance form dated today.  Positive responses are shown above.  If the symptom is not checked, the patient has denied it. Vital Signs   Recorded by Alexander Franklin on 14 Mar 2012 02:31 PM BP:128/86,  Temp: 101.5 F,  Height: 70 in, Weight: 217 lb, BMI: 31.1 kg/m2,  BSA Calculated: 2.16 ,  BMI Calculated: 31.13. Physical Exam  APPEARANCE: Well developed, overweight,  in no acute distress.  Normal affect, in a pleasant mood.  Oriented to time, place and person. COMMUNICATION: Normal voice   HEAD & FACE:  No scars, lesions or masses of head and face.  Sinuses nontender to palpation.  Salivary glands without mass or tenderness.  Facial strength symmetric.  No  facial lesion, scars, or mass. EYES: EOMI with normal primary gaze alignment. Visual acuity grossly intact.  PERRLA EXTERNAL EAR & NOSE: No scars, lesions or masses  EAC & TYMPANIC MEMBRANE:  EAC shows no obstructing lesions or debris and tympanic membranes are normal bilaterally. GROSS HEARING: Normal   TMJ:  Nontender  INTRANASAL EXAM: No polyps or purulence. Moderate hypertrophy of right inferior turbinate.  NASOPHARYNX: Normal, without lesions. LIPS, TEETH & GUMS: No lip lesions, normal dentition and normal gums. ORAL CAVITY/OROPHARYNX:  Oral mucosa moist without lesion or asymmetry of the palate, tongue or posterior pharynx. Tonsils 3+ with exudate bilaterally. Uvula midline. No trismus or halitosis. NECK:  Supple without adenopathy or mass. THYROID:  Normal with no masses palpable.  NEUROLOGIC:  No gross CN deficits. No nystagmus noted.   LYMPHATIC:  No enlarged nodes palpable. CARDIOVASCULAR: regular rate and rhythm, tachycardia. No carotid, pre- or postauricular bruits auscultated on the right. Signature  Electronically signed by : Alexander Hacker  PA-C; 03/14/2012 3:46 PM EST. Electronically signed by : Alexander Franklin  M.D.; 03/14/2012 4:01 PM EST.

## 2012-03-29 NOTE — Op Note (Signed)
03/29/2012  12:48 PM  PATIENT:  Alexander Franklin  31 y.o. male  PRE-OPERATIVE DIAGNOSIS:  chronic tonsilitis  POST-OPERATIVE DIAGNOSIS:  chronic tonsilitis  PROCEDURE:  Procedure(s): TONSILLECTOMY  SURGEON:  Surgeon(s): Serena Colonel, MD  ANESTHESIA:   general  COUNTS:  YES   DICTATION: The patient was taken to the operating room and placed on the operating table in the supine position. Following induction of general endotracheal anesthesia, the table was turned and the patient was draped in a standard fashion. A Crowe-Davis mouthgag was inserted into the oral cavity and used to retract the tongue and mandible, then attached to the Mayo stand.  The tonsillectomy was then performed using electrocautery dissection, carefully dissecting the avascular plane between the capsule and constrictor muscles. Cautery was used for completion of hemostasis. The tonsils were discarded.  The pharynx was irrigated with saline and suctioned. An oral gastric tube was used to aspirate the contents of the stomach. The patient was then awakened from anesthesia and transferred to PACU in stable condition.   PATIENT DISPOSITION:  PACU - hemodynamically stable.

## 2012-04-01 ENCOUNTER — Encounter (HOSPITAL_BASED_OUTPATIENT_CLINIC_OR_DEPARTMENT_OTHER): Payer: Self-pay | Admitting: Otolaryngology

## 2012-05-23 ENCOUNTER — Encounter: Payer: 59 | Admitting: Family Medicine

## 2012-05-23 DIAGNOSIS — Z0289 Encounter for other administrative examinations: Secondary | ICD-10-CM

## 2013-08-25 IMAGING — CT CT NECK W/ CM
4 of 5 series · 16 of 33 positions shown, 18 images · IV contrast (APPLIED)
Comparison: 01/04/2012 radiograph

CLINICAL DATA: Fever, sore throat.

CT NECK WITH CONTRAST
TECHNIQUE: Multidetector CT imaging of the neck was performed with
intravenous contrast.
Contrast: 50mL OMNIPAQUE IOHEXOL 300 MG/ML  SOLN

[Series 3: st neck 2.0 b31s · axial · 0.54mm/px · z∈[+946,+1064]mm · 4 of 99 slices shown, 5 images]
[im 20/99  soft-tissue]
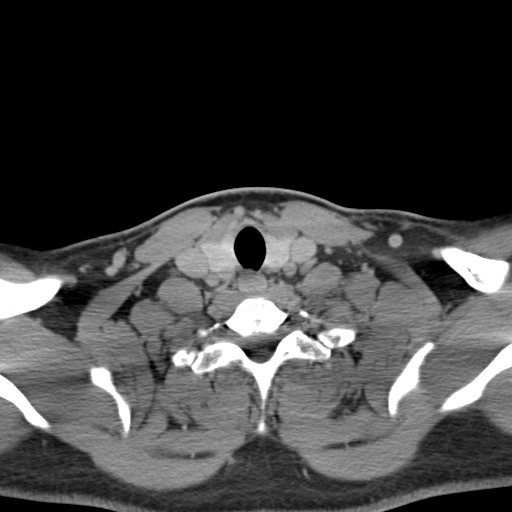
[im 20/99  bone]
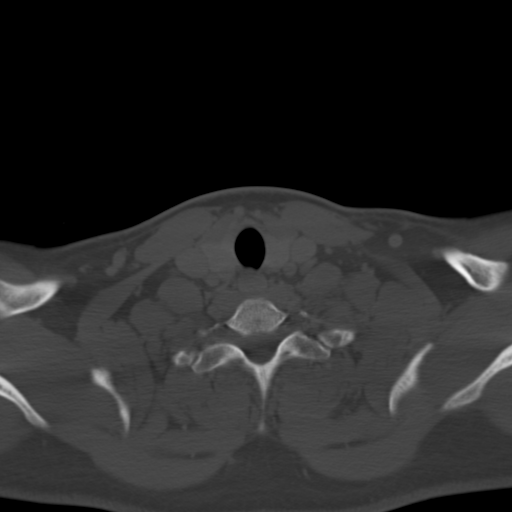
[im 40/99  bone]
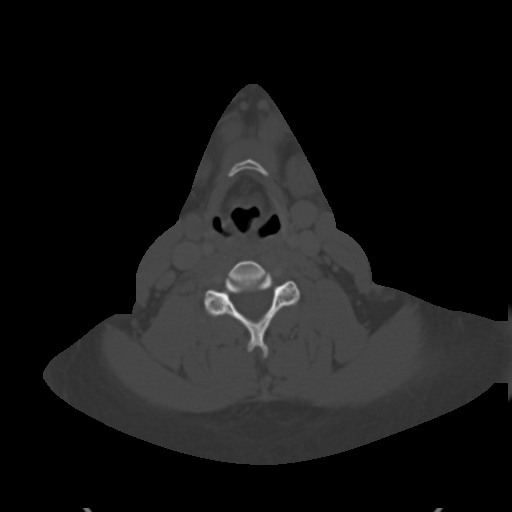
[im 59/99  bone]
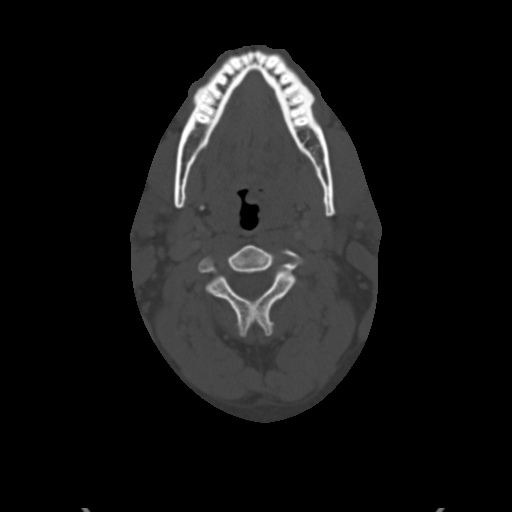
[im 79/99  bone]
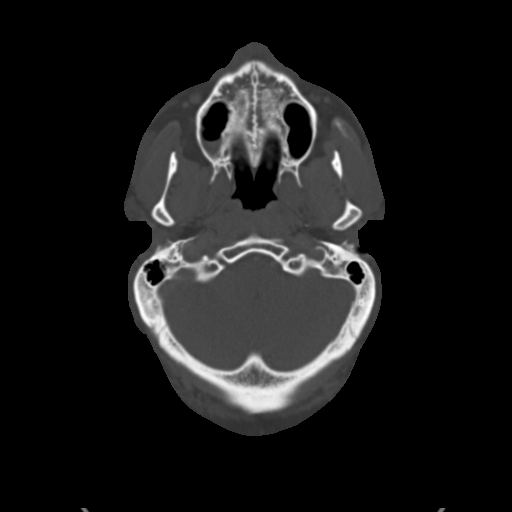

[Series 6: coronals · coronal · 0.43mm/px · 3 of 126 slices shown]
[im 30/126  bone]
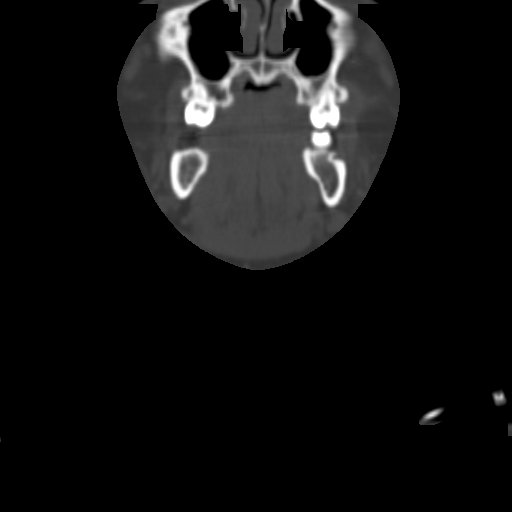
[im 52/126  bone]
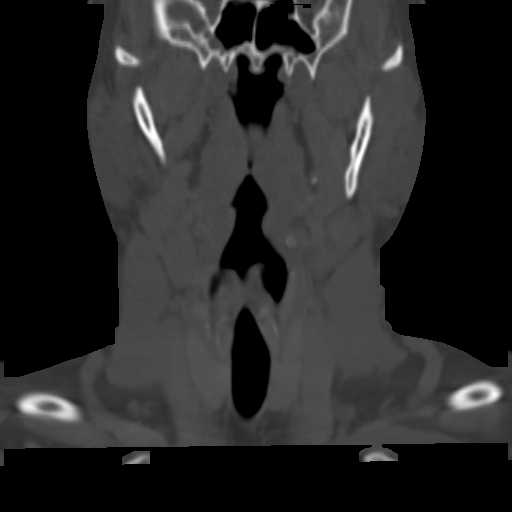
[im 74/126  bone]
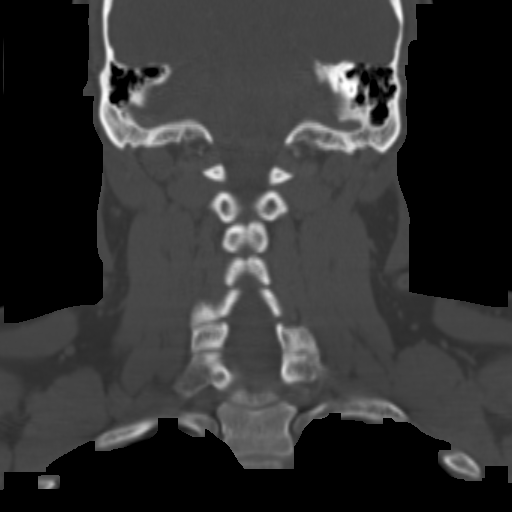

[Series 7: sagittals · sagittal · 0.53mm/px · 5 of 135 slices shown, 6 images]
[im 45/135  bone]
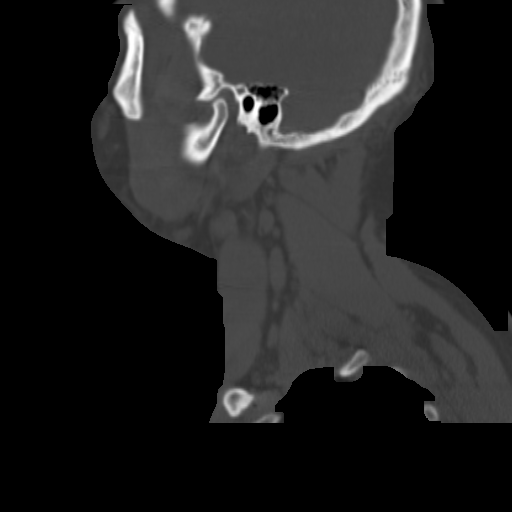
[im 56/135  bone]
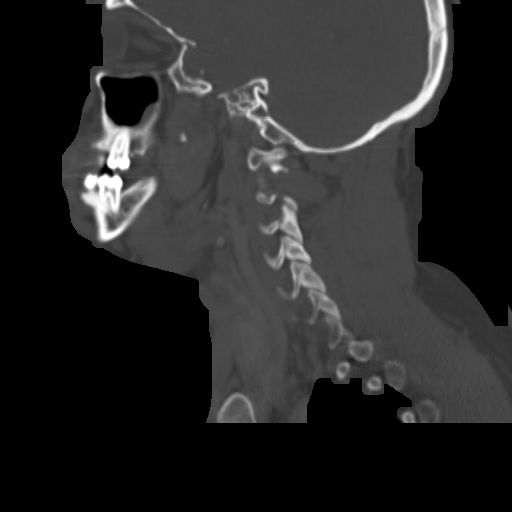
[im 68/135  soft-tissue]
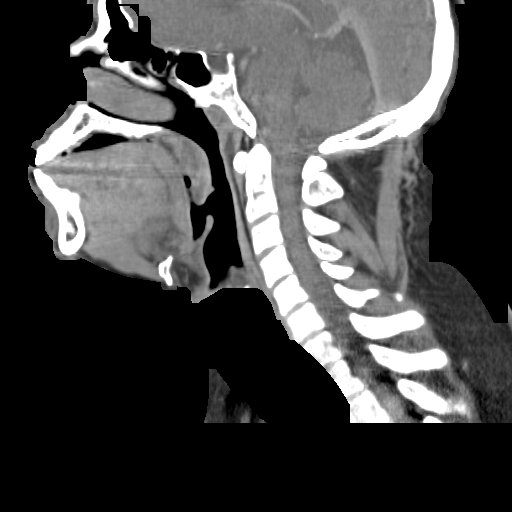
[im 68/135  bone]
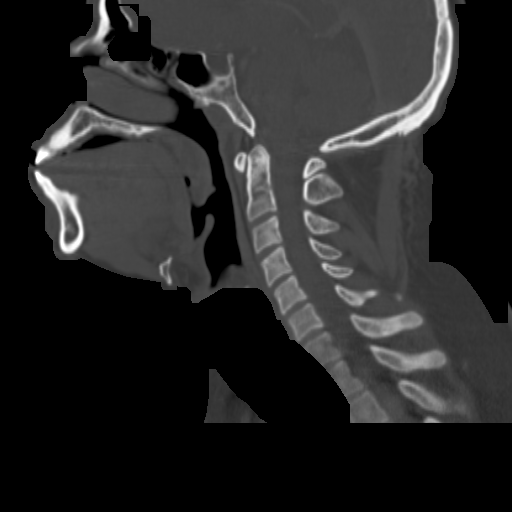
[im 79/135  bone]
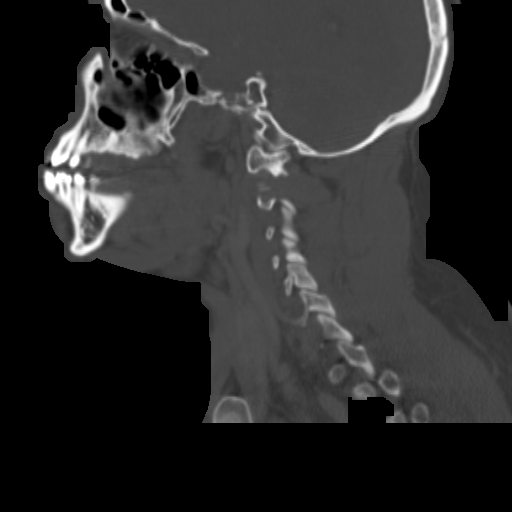
[im 90/135  bone]
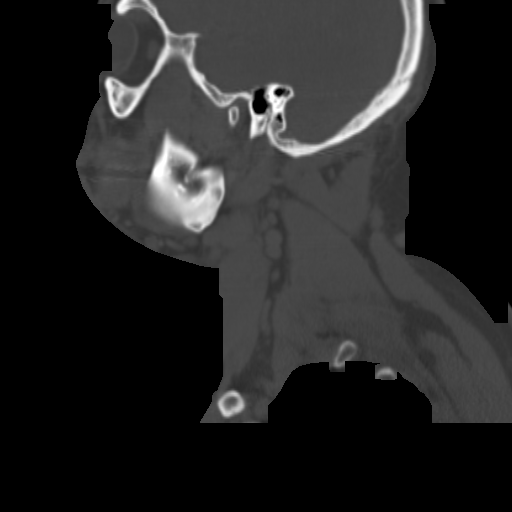

[Series 8: orthogonals · axial · 0.48mm/px · z∈[+918,+1041]mm · 4 of 105 slices shown]
[im 21/105  bone]
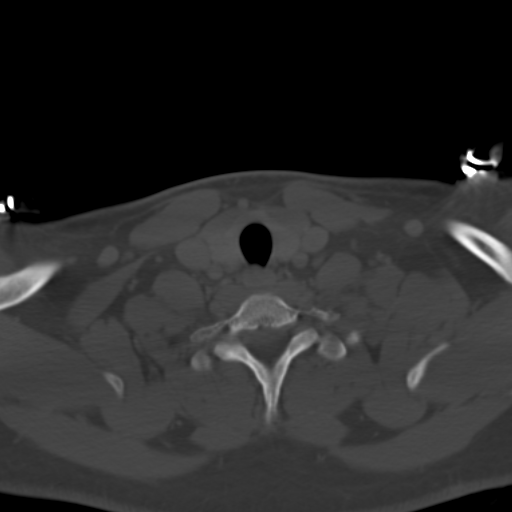
[im 42/105  bone]
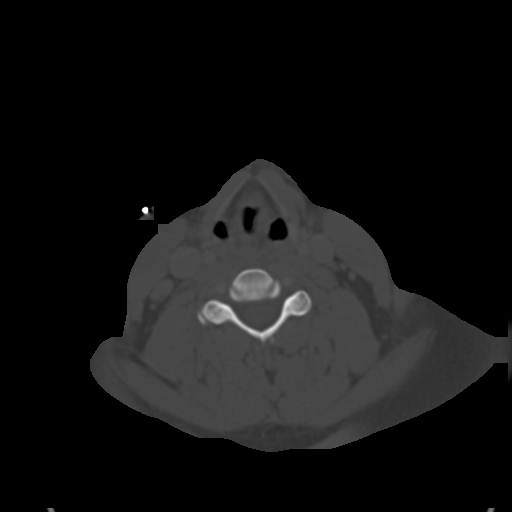
[im 63/105  bone]
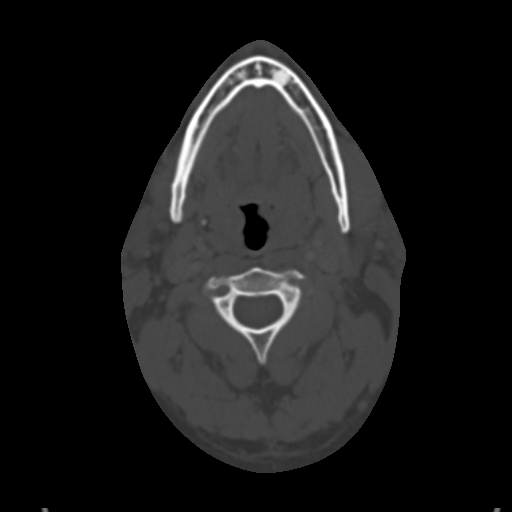
[im 84/105  bone]
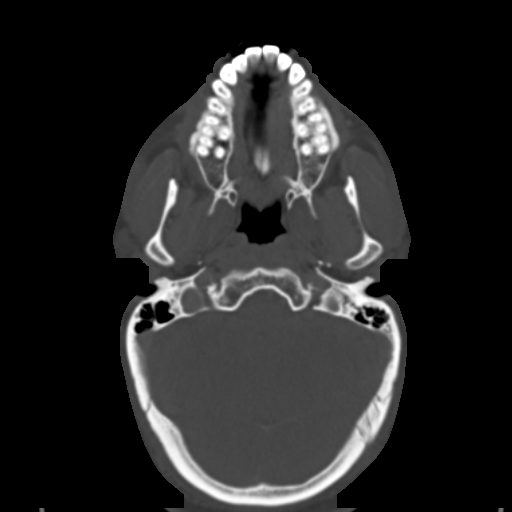

[16 of 33 positions shown; findings below may reference images not displayed]

FINDINGS: Lung apices are clear.  Limited images through the
posterior fossa/lower temporal lobes show no acute intracranial
finding.

Partially opacified right maxillary sinus may represent a polyp. A
small polyp versus mucosal thickening within each sphenoid chamber.
Visualized paranasal sinuses and mastoid air cells are otherwise
predominately clear.

Unremarkable nasal cavity and nasopharynx.  Unremarkable oral
cavity.  Bilateral tonsillar fullness narrows the oropharyngeal
airway however, no peritonsillar abscess identified.  Unremarkable
hypopharynx and epiglottis.  Unremarkable larynx.

Symmetric parotid and submandibular glands.  There are bilateral
prominent cervical chain lymph nodes, measuring up to 1.6 cm short
axis.

Normal caliber vasculature.  Unremarkable thyroid gland.

No acute osseous finding.
IMPRESSION: Tonsillar fullness narrows the oropharyngeal airway without
peritonsillar abscess identified.

Prominent bilateral cervical chain lymph nodes.  May be reactive
however recommend continued attention with physical exam and follow
up imaging if warranted to document resolution.

## 2013-12-22 ENCOUNTER — Ambulatory Visit (INDEPENDENT_AMBULATORY_CARE_PROVIDER_SITE_OTHER): Payer: 59 | Admitting: Internal Medicine

## 2013-12-22 ENCOUNTER — Encounter: Payer: Self-pay | Admitting: Internal Medicine

## 2013-12-22 ENCOUNTER — Telehealth: Payer: Self-pay

## 2013-12-22 VITALS — BP 115/81 | HR 100 | Temp 98.3°F | Ht 70.5 in | Wt 228.0 lb

## 2013-12-22 DIAGNOSIS — Z0000 Encounter for general adult medical examination without abnormal findings: Secondary | ICD-10-CM | POA: Insufficient documentation

## 2013-12-22 DIAGNOSIS — A630 Anogenital (venereal) warts: Secondary | ICD-10-CM

## 2013-12-22 NOTE — Progress Notes (Signed)
Pre visit review using our clinic review tool, if applicable. No additional management support is needed unless otherwise documented below in the visit note. 

## 2013-12-22 NOTE — Assessment & Plan Note (Addendum)
Check labs, refer to derm, safe sex discussed.  Pt knows lesions are contagious

## 2013-12-22 NOTE — Assessment & Plan Note (Addendum)
Td ~ 2003 Never had a cscope Labs Counseled-- Diet, exercise, safe sex, self testicular exam, risks of tobacco, recommend to start thinking about quitting.

## 2013-12-22 NOTE — Progress Notes (Signed)
   Subjective:    Patient ID: Alexander Franklin, male    DOB: 19-Feb-1981, 33 y.o.   MRN: [id number]  DOS:  12/22/2013 Type of  Visit: CPX  Also reports one-year history of penile lesions, patient concerned about it. Denies any penile discharge   ROS Diet-- regular, trying to eat healthier Exercise-- takes a walk daily No  CP, SOB No palpitations, no lower extremity edema Denies  nausea, vomiting diarrhea, blood in the stools No GERD  Sx.  (-) cough, sputum production (-) wheezing, chest congestion No dysuria, gross hematuria, difficulty urinating  No anxiety, occ depression, no suicidal ideas     Past Medical History  Diagnosis Date  . Depression     no current meds.  . Chronic tonsillitis 03/2012    Past Surgical History  Procedure Laterality Date  . Wisdom tooth extraction    . Tonsillectomy  03/29/2012    Procedure: TONSILLECTOMY;  Surgeon: Serena Colonel, MD;  Location: [facility];  Service: ENT;  Laterality: N/A;    History   Social History  . Marital Status: Single    Spouse Name: N/A    Number of Children: 0  . Years of Education: N/A   Occupational History  . customer service  [employer]   Social History Main Topics  . Smoking status: Current Every Day Smoker -- 0.50 packs/day for 14 years    Types: Cigarettes  . Smokeless tobacco: Never Used     Comment: 1/2 ppd  . Alcohol Use: Yes     Comment: occasionally   . Drug Use: No  . Sexual Activity: Not on file   Other Topics Concern  . Not on file   Social History Narrative   Sister lives w/ pt      Family History  Problem Relation Age of Onset  . Diabetes Neg Hx     ?  . Heart failure Neg Hx   . Colon cancer Neg Hx   . Prostate cancer Neg Hx        Medication List    Notice As of 12/22/2013 10:00 PM   You have not been prescribed any medications.         Objective:   Physical Exam BP 115/81  Pulse 100  Temp(Src) 98.3 F (36.8 C)  Ht 5' 10.5" (1.791 m)  Wt 228  lb (103.42 kg)  BMI 32.24 kg/m2  SpO2 95%  General -- alert, well-developed, NAD.  Neck --no thyromegaly  HEENT-- Not pale.  Lungs -- normal respiratory effort, no intercostal retractions, no accessory muscle use, and normal breath sounds.  Heart-- normal rate, regular rhythm, no murmur.  Abdomen-- Not distended, good bowel sounds,soft, non-tender. GU-- Penis: several wart like lesions, mostly on the L side of the penis, no penile d/c scrotal contents normal Extremities-- no pretibial edema bilaterally  Neurologic--  alert & oriented X3. Speech normal, gait appropriate for age, strength symmetric and appropriate for age.  Psych-- Cognition and judgment appear intact. Cooperative with normal attention span and concentration. No anxious or depressed appearing.       Assessment & Plan:

## 2013-12-22 NOTE — Telephone Encounter (Signed)
Patient called back to return your phone call.

## 2013-12-22 NOTE — Telephone Encounter (Signed)
Left message for call back Non-identifiable   NEW PATIENT 

## 2013-12-22 NOTE — Patient Instructions (Signed)
Get your blood work before you leave   Next visit is for a physical exam in 1 year        Genital Warts Genital warts are a sexually transmitted infection. They may appear as small bumps on the tissues of the genital area. CAUSES  Genital warts are caused by a virus called human papillomavirus (HPV). HPV is the most common sexually transmitted disease (STD) and infection of the sex organs. This infection is spread by having unprotected sex with an infected person. It can be spread by vaginal, anal, and oral sex. Many people do not know they are infected. They may be infected for years without problems. However, even if they do not have problems, they can unknowingly pass the infection to their sexual partners. SYMPTOMS   Itching and irritation in the genital area.  Warts that bleed.  Painful sexual intercourse. DIAGNOSIS  Warts are usually recognized with the naked eye on the vagina, vulva, perineum, anus, and rectum. Certain tests can also diagnose genital warts, such as:  A Pap test.  A tissue sample (biopsy) exam.  Colposcopy. A magnifying tool is used to examine the vagina and cervix. The HPV cells will change color when certain solutions are used. TREATMENT  Warts can be removed by:  Applying certain chemicals, such as cantharidin or podophyllin.  Liquid nitrogen freezing (cryotherapy).  Immunotherapy with candida or trichophyton injections.  Laser treatment.  Burning with an electrified probe (electrocautery).  Interferon injections.  Surgery. PREVENTION  HPV vaccination can help prevent HPV infections that cause genital warts and that cause cancer of the cervix. It is recommended that the vaccination be given to people between the ages 59 to 48 years old. The vaccine might not work as well or might not work at all if you already have HPV. It should not be given to pregnant women. HOME CARE INSTRUCTIONS   It is important to follow your caregiver's instructions.  The warts will not go away without treatment. Repeat treatments are often needed to get rid of warts. Even after it appears that the warts are gone, the normal tissue underneath often remains infected.  Do not try to treat genital warts with medicine used to treat hand warts. This type of medicine is strong and can burn the skin in the genital area, causing more damage.  Tell your past and current sexual partner(s) that you have genital warts. They may be infected also and need treatment.  Avoid sexual contact while being treated.  Do not touch or scratch the warts. The infection may spread to other parts of your body.  Women with genital warts should have a cervical cancer check (Pap test) at least once a year. This type of cancer is slow-growing and can be cured if found early. Chances of developing cervical cancer are increased with HPV.  Inform your obstetrician about your warts in the event of pregnancy. This virus can be passed to the baby's respiratory tract. Discuss this with your caregiver.  Use a condom during sexual intercourse. Following treatment, the use of condoms will help prevent reinfection.  Ask your caregiver about using over-the-counter anti-itch creams. SEEK MEDICAL CARE IF:   Your treated skin becomes red, swollen, or painful.  You have a fever.  You feel generally ill.  You feel little lumps in and around your genital area.  You are bleeding or have painful sexual intercourse. MAKE SURE YOU:   Understand these instructions.  Will watch your condition.  Will get help right  away if you are not doing well or get worse. Document Released: 07/07/2000 Document Revised: 10/02/2011 Document Reviewed: 01/16/2011 [facility] Patient Information 2014 [place].    Testicular Self-Exam A self-examination of your testicles involves looking at and feeling your testicles for abnormal lumps or swelling. Several things can cause swelling, lumps, or pain in your  testicles. Some of these causes are:  Injuries.  Inflammation.  Infection.  Accumulation of fluids around your testicle (hydrocele).  Twisted testicles (testicular torsion).  Testicular cancer. Self-examination of the testicles and groin areas may be advised if you are at risk for testicular cancer. Risks for testicular cancer include:  An undescended testicle (cryptorchidism).  A history of previous testicular cancer.  A family history of testicular cancer. The testicles are easiest to examine after warm baths or showers and are more difficult to examine when you are cold. This is because the muscles attached to the testicles retract and pull them up higher or into the abdomen. Follow these steps while you are standing:  Hold your penis away from your body.  Roll one testicle between your thumb and forefinger, feeling the entire testicle.  Roll the other testicle between your thumb and forefinger, feeling the entire testicle. Feel for lumps, swelling, or discomfort. A normal testicle is egg shaped and feels firm. It is smooth and not tender. The spermatic cord can be felt as a firm spaghetti-like cord at the back of your testicle. It is also important to examine the crease between the front of your leg and your abdomen. Feel for any bumps that are tender. These could be enlarged lymph nodes.  Document Released: 10/16/2000 Document Revised: 03/12/2013 Document Reviewed: 12/30/2012 [facility] Patient Information 2014 [place], [place].    Safe Sex Safe sex is about reducing the risk of giving or getting a sexually transmitted disease (STD). STDs are spread through sexual contact involving the genitals, mouth, or rectum. Some STDS can be cured and others cannot. Safe sex can also prevent unintended pregnancies.  SAFE SEX PRACTICES  Limit your sexual activity to only one partner who is only having sex with you.  Talk to your partner about their past partners, past STDs, and drug  use.  Use a condom every time you have sexual intercourse. This includes vaginal, oral, and anal sexual activity. Both females and males should wear condoms during oral sex. Only use latex or polyurethane condoms and water-based lubricants. Petroleum-based lubricants or oils used to lubricate a condom will weaken the condom and increase the chance that it will break. The condom should be in place from the beginning to the end of sexual activity. Wearing a condom reduces, but does not completely eliminate, your risk of getting or giving a STD. STDs can be spread by contact with skin of surrounding areas.  Get vaccinated for hepatitis B and HPV.  Avoid alcohol and recreational drugs which can affect your judgement. You may forget to use a condom or participate in high-risk sex.  For females, avoid douching after sexual intercourse. Douching can spread an infection farther into the reproductive tract.  Check your body for signs of sores, blisters, rashes, or unusual discharge. See your caregiver if you notice any of these signs.  Avoid sexual contact if you have symptoms of an infection or are being treated for an STD. If you or your partner has herpes, avoid sexual contact when blisters are present. Use condoms at all other times.  See your caregiver for regular screenings, examinations, and tests for STDs.  Before having sex with a new partner, each of you should be screened for STDs and talk about the results with your partner. BENEFITS OF SAFE SEX   There is less of a chance of getting or giving an STD.  You can prevent unwanted or unintended pregnancies.  By discussing safer sex concerns with your partner, you may increase feelings of intimacy, comfort, trust, and honesty between the both of you. Document Released: 08/17/2004 Document Revised: 04/03/2012 Document Reviewed: 01/01/2012 [facility] Patient Information 2014 [place], [place].

## 2013-12-23 LAB — CBC WITH DIFFERENTIAL/PLATELET
BASOS ABS: 0.1 10*3/uL (ref 0.0–0.1)
Basophils Relative: 0.5 % (ref 0.0–3.0)
EOS ABS: 0.1 10*3/uL (ref 0.0–0.7)
Eosinophils Relative: 1.2 % (ref 0.0–5.0)
HCT: 44.7 % (ref 39.0–52.0)
Hemoglobin: 15 g/dL (ref 13.0–17.0)
LYMPHS PCT: 32.2 % (ref 12.0–46.0)
Lymphs Abs: 3.4 10*3/uL (ref 0.7–4.0)
MCHC: 33.5 g/dL (ref 30.0–36.0)
MCV: 84.7 fl (ref 78.0–100.0)
Monocytes Absolute: 0.4 10*3/uL (ref 0.1–1.0)
Monocytes Relative: 3.9 % (ref 3.0–12.0)
NEUTROS ABS: 6.6 10*3/uL (ref 1.4–7.7)
NEUTROS PCT: 62.2 % (ref 43.0–77.0)
PLATELETS: 294 10*3/uL (ref 150.0–400.0)
RBC: 5.28 Mil/uL (ref 4.22–5.81)
RDW: 14.4 % (ref 11.5–15.5)
WBC: 10.6 10*3/uL — ABNORMAL HIGH (ref 4.0–10.5)

## 2013-12-23 LAB — RPR

## 2013-12-23 LAB — COMPREHENSIVE METABOLIC PANEL
ALBUMIN: 4.3 g/dL (ref 3.5–5.2)
ALT: 31 U/L (ref 0–53)
AST: 21 U/L (ref 0–37)
Alkaline Phosphatase: 60 U/L (ref 39–117)
BUN: 11 mg/dL (ref 6–23)
CALCIUM: 9.6 mg/dL (ref 8.4–10.5)
CHLORIDE: 107 meq/L (ref 96–112)
CO2: 24 meq/L (ref 19–32)
Creatinine, Ser: 1.3 mg/dL (ref 0.4–1.5)
GFR: 84.93 mL/min (ref >60.00)
Glucose, Bld: 79 mg/dL (ref 70–99)
POTASSIUM: 4 meq/L (ref 3.5–5.1)
Sodium: 139 mEq/L (ref 135–145)
Total Bilirubin: 0.6 mg/dL (ref 0.2–1.2)
Total Protein: 7.4 g/dL (ref 6.0–8.3)

## 2013-12-23 LAB — HIV ANTIBODY (ROUTINE TESTING W REFLEX): HIV 1&2 Ab, 4th Generation: NONREACTIVE

## 2013-12-23 LAB — LIPID PANEL
CHOL/HDL RATIO: 6
CHOLESTEROL: 198 mg/dL (ref 0–200)
HDL: 33.7 mg/dL — ABNORMAL LOW (ref >39.00)
LDL Cholesterol: 125 mg/dL — ABNORMAL HIGH (ref 0–99)
TRIGLYCERIDES: 195 mg/dL — AB (ref 0.0–149.0)
VLDL: 39 mg/dL (ref 0.0–40.0)

## 2013-12-23 LAB — TSH: TSH: 0.61 u[IU]/mL (ref 0.35–4.50)

## 2013-12-23 NOTE — Telephone Encounter (Signed)
Unable to reach patient pre visit.  

## 2013-12-25 ENCOUNTER — Encounter: Payer: Self-pay | Admitting: *Deleted

## 2013-12-25 NOTE — Progress Notes (Signed)
Letter sent.

## 2015-10-04 ENCOUNTER — Ambulatory Visit (INDEPENDENT_AMBULATORY_CARE_PROVIDER_SITE_OTHER): Payer: Managed Care, Other (non HMO) | Admitting: Family Medicine

## 2015-10-04 VITALS — BP 122/84 | HR 93 | Temp 98.4°F | Resp 20 | Ht 70.0 in | Wt 211.8 lb

## 2015-10-04 DIAGNOSIS — J209 Acute bronchitis, unspecified: Secondary | ICD-10-CM

## 2015-10-04 MED ORDER — HYDROCODONE-HOMATROPINE 5-1.5 MG/5ML PO SYRP: 5.0000 mL | ORAL_SOLUTION | Freq: Three times a day (TID) | ORAL | Status: DC | PRN

## 2015-10-04 MED ORDER — AZITHROMYCIN 250 MG PO TABS: ORAL_TABLET | ORAL | Status: DC

## 2015-10-04 NOTE — Patient Instructions (Signed)

## 2015-10-04 NOTE — Progress Notes (Signed)
° °  Subjective:    Patient ID: Alexander Franklin, male    DOB: 04/14/1981, 35 y.o.   MRN: [id number] By signing my name below, I, Alexander Franklin, attest that this documentation has been prepared under the direction and in the presence of Elvina SidleKurt Lauenstein, MD.  Electronically Signed: Littie Deedsichard Franklin, Medical Scribe. 10/04/2015. 1:23 PM.  HPI HPI Comments: Alexander Franklin Coiner is a 35 y.o. male who presents to the [facility] complaining of gradual onset cough that started 3 days ago. Patient has had a fever for the past few days. The fever has resolved, but he still has a cough. Patient notes he has been coughing up mucus and blood. He has also had right-sided rib pain which be believes may be due to the cough. The pain is worse with deep breaths.  Patient works at the call center for [employer].  Review of Systems  Constitutional: Positive for fever.  Respiratory: Positive for cough.   Cardiovascular: Positive for chest pain.       Objective:   Physical Exam CONSTITUTIONAL: Well developed/well nourished HEAD: Normocephalic/atraumatic EYES: EOM/PERRL ENMT: Mucous membranes moist. Blood-streaked sputum. NECK: supple no meningeal signs SPINE: entire spine nontender CV: S1/S2 noted, no murmurs/rubs/gallops noted LUNGS: Lungs are clear to auscultation bilaterally, no apparent distress. Tender ribs. ABDOMEN: soft, nontender, no rebound or guarding GU: no cva tenderness NEURO: Pt is awake/alert, moves all extremitiesx4 EXTREMITIES: pulses normal, full ROM SKIN: warm, color normal PSYCH: no abnormalities of mood noted      Assessment & Plan:   This chart was scribed in my presence and reviewed by me personally.    ICD-9-CM ICD-10-CM   1. Acute bronchitis, unspecified organism 466.0 J20.9 azithromycin (ZITHROMAX) 250 MG tablet     HYDROcodone-homatropine (HYCODAN) 5-1.5 MG/5ML syrup     Signed, Elvina SidleKurt Lauenstein, MD

## 2015-10-07 ENCOUNTER — Telehealth: Payer: Self-pay | Admitting: Family Medicine

## 2015-10-07 NOTE — Telephone Encounter (Signed)
The patient wants the work note to include Friday, 10/08/15.  He went to work on 10/07/15, but did not feel like he could perform his duties.  He states that he will return to work on Monday.

## 2015-10-07 NOTE — Telephone Encounter (Signed)
That is fine - he should take delsym during times he needs to be at work and just use the hycodan at night

## 2015-10-07 NOTE — Telephone Encounter (Signed)
Pt needs a extended work note because the Codiene is making him not function at work. He also needs to find out what else he can take.  Please advise  [phone]

## 2015-10-07 NOTE — Telephone Encounter (Signed)
Patient came in office to get updated work note. He was notified will have to get provider approval and will call him once it is complete or what they advise. Patient ph # [phone]

## 2015-10-08 NOTE — Telephone Encounter (Signed)
Spoke to patient.  Advised him that new note was ready for pickup.  He should only take hycodan at night and delsym during the times he is at work because hycodan makes you sleepy.  He verbalized understanding.  He will pick up his note shortly.

## 2015-10-29 ENCOUNTER — Telehealth: Payer: Self-pay

## 2015-10-29 NOTE — Telephone Encounter (Signed)
Patient needs FMLA forms completed by Dr L for his treatment of Acute bronchitis, unspecified organism, I have filled out all the paperwork I just need for it to be signed and returned to my FMLA/Disability box at the [employer] checkout desk with in 5-7 business days. I will place in your box at the end of the day on 10/29/15, there are two pages that need to be signed I have highlighted both areas. Thank you!

## 2015-10-30 NOTE — Telephone Encounter (Signed)
Forms completed and placed in TL box today

## 2015-11-01 NOTE — Telephone Encounter (Signed)
[facility]

## 2015-11-02 DIAGNOSIS — Z0271 Encounter for disability determination: Secondary | ICD-10-CM

## 2018-05-18 ENCOUNTER — Encounter (HOSPITAL_COMMUNITY): Payer: Self-pay

## 2018-05-18 ENCOUNTER — Other Ambulatory Visit: Payer: Self-pay

## 2018-05-18 ENCOUNTER — Emergency Department (HOSPITAL_COMMUNITY)
Admission: EM | Admit: 2018-05-18 | Discharge: 2018-05-20 | Disposition: A | Discharge status: Home / Self Care | Payer: Self-pay | Attending: Emergency Medicine | Admitting: Emergency Medicine

## 2018-05-18 DIAGNOSIS — F200 Paranoid schizophrenia: Secondary | ICD-10-CM | POA: Insufficient documentation

## 2018-05-18 DIAGNOSIS — F1290 Cannabis use, unspecified, uncomplicated: Secondary | ICD-10-CM | POA: Insufficient documentation

## 2018-05-18 DIAGNOSIS — F209 Schizophrenia, unspecified: Secondary | ICD-10-CM

## 2018-05-18 DIAGNOSIS — F17210 Nicotine dependence, cigarettes, uncomplicated: Secondary | ICD-10-CM | POA: Insufficient documentation

## 2018-05-18 LAB — COMPREHENSIVE METABOLIC PANEL
ALBUMIN: 4.6 g/dL (ref 3.5–5.0)
ALK PHOS: 76 U/L (ref 38–126)
ALT: 25 U/L (ref 0–44)
ANION GAP: 9 (ref 5–15)
AST: 26 U/L (ref 15–41)
BUN: 9 mg/dL (ref 6–20)
CALCIUM: 9.2 mg/dL (ref 8.9–10.3)
CO2: 20 mmol/L — AB (ref 22–32)
CREATININE: 0.97 mg/dL (ref 0.61–1.24)
Chloride: 105 mmol/L (ref 98–111)
GFR calc Af Amer: >60 mL/min (ref >60)
GFR calc non Af Amer: >60 mL/min (ref >60)
GLUCOSE: 101 mg/dL — AB (ref 70–99)
Potassium: 3.7 mmol/L (ref 3.5–5.1)
SODIUM: 134 mmol/L — AB (ref 135–145)
Total Bilirubin: 0.8 mg/dL (ref 0.3–1.2)
Total Protein: 8.4 g/dL — ABNORMAL HIGH (ref 6.5–8.1)

## 2018-05-18 LAB — CBC
HCT: 46.8 % (ref 39.0–52.0)
Hemoglobin: 14.9 g/dL (ref 13.0–17.0)
MCH: 27.3 pg (ref 26.0–34.0)
MCHC: 31.8 g/dL (ref 30.0–36.0)
MCV: 85.7 fL (ref 80.0–100.0)
NRBC: 0 % (ref 0.0–0.2)
PLATELETS: 339 10*3/uL (ref 150–400)
RBC: 5.46 MIL/uL (ref 4.22–5.81)
RDW: 14.1 % (ref 11.5–15.5)
WBC: 13.7 10*3/uL — ABNORMAL HIGH (ref 4.0–10.5)

## 2018-05-18 LAB — RAPID URINE DRUG SCREEN, HOSP PERFORMED
Amphetamines: NOT DETECTED
Barbiturates: NOT DETECTED
Benzodiazepines: NOT DETECTED
Cocaine: NOT DETECTED
OPIATES: NOT DETECTED
Tetrahydrocannabinol: POSITIVE — AB

## 2018-05-18 LAB — ETHANOL: Alcohol, Ethyl (B): <10 mg/dL (ref <10)

## 2018-05-18 MED ORDER — ACETAMINOPHEN 500 MG PO TABS
1000.0000 mg | ORAL_TABLET | Freq: Four times a day (QID) | ORAL | Status: DC | PRN
  Administered 2018-05-18 (×2): 1000 mg via ORAL
  Filled 2018-05-18 (×2): qty 2

## 2018-05-18 MED ORDER — OLANZAPINE 5 MG PO TABS
5.0000 mg | ORAL_TABLET | Freq: Two times a day (BID) | ORAL | Status: DC
  Administered 2018-05-18 – 2018-05-19 (×3): 5 mg via ORAL
  Filled 2018-05-18 (×3): qty 1

## 2018-05-18 NOTE — ED Triage Notes (Signed)
He is with his significant other and his mom. With pt. Permission, his mom tells me that pt. Is "hearing voices that are telling him they are going to kill him. And he is feeling the ground vibrate under his feet and things like that". 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 Pt. Is tearful and cooperative and agrees with all that is being spoken. They tell me this has "been going on a little bit for a while now, but it's really gotten bad for the past two weeks. They also tell me pt. Has never had to seek medical help for this issue before.

## 2018-05-18 NOTE — ED Notes (Signed)
Pt to room #34. Pt denies SI/HI. Endorsing AH. "My family brought me here because I am hearing voices and feeling threatened." Pt behavior cooperative, pleasant on approach. encouragement and support provided. Special checks q 15 mins in place for safety, video monitoring in place. Will continue to monitor.

## 2018-05-18 NOTE — ED Notes (Signed)
Catha Nottingham DNP, and TTS into see

## 2018-05-18 NOTE — ED Notes (Signed)
Bed: [facility]4 Expected date:  Expected time:  Means of arrival:  Comments: 

## 2018-05-18 NOTE — BH Assessment (Addendum)
Assessment Note  Alexander Franklin is an 37 y.o. male that presents this date voluntary with depression and paranoia. Patient also has a history of PTSD with AH stating that voices in his head are telling him to do things but he is unsure of what they are saying. Patient denies any VH although reports active S/I. Patient denies any plan or intent although is unsure if the voices are command in nature or if they are telling him to harm himself. Patient feels as if there are people watching him and that his legs are being controlled by somebody else. Patient denies any previous attempts/gestures at self harm or homicidal ideations. Patient reports daily use of marijuana but denies any use of alcohol or any other substances. Patient's UDS is positive for Cannabis. Patient does not take any current psychiatric medications although reports a prior history of depression. Patient denies any current medication interventions although reports wosening depression over the last month with symptoms to include: excessive guilt and anhedonia. Patient reports his AH have been progressive over the last few days and his family finally made him come to the ED today. Patient is voluntarily and escorted by his girlfriend and her mother for evaluation of paranoia and auditory hallucinations. Patient is oriented to person, place and time and appears calm and cooperative with this Clinical research associate. He reports hearing voices in his head "due to technology". States he initially thought this was due to his phone or computer being hacked. He verbalizes that he does not think this is possible but he qualifies this as a fixed belief. He reports being in his normal state of health 6 months ago. At that time, he moved into an apartment with his girlfriend and gradually began hearing audible voices. He is uncertain what the voices are saying but reports several people in his head holding a conversation usually about him. He also reports hearing the neighbors  conversations about him. Patient reports regularly hearing neighbors discuss his activity in the home, "I hear them saying, there he is, now he's moving." Of note, he is the only one hearing these voices and states his girlfriend does not hear it. States the voices makes him feel, "crazy". He also states he has the sensation the ground is moving beneath him. He reports smoking "2 joints" daily denies other illicit substance use. States he smoked marijuana since high school. 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 Patient reports his PCP is through the [place] although denies having any current psychiatrist. He reports a occasional beer or "shot" usage. Infrequently drinks tea and does not drink other caffeinated beverages. Reports frequent rumination that interferes with his ability to wind down and relax at bedtime. He denies medical concerns and denies actively taking prescribed medications. He is an [place] veteran and states he receives medical care at the [facility] in [place]. He reports being referred to an outpatient psychiatric provider and diagnosed with PTSD but he is not followed by a clinician for outpatient psychiatric care. He denies familiar history for medical concerns. Endorses a mental health history for his maternal great-grandmother for depression. He reports his mother and sister live locally and are a support system for him. He does not have a relationship with his father. Case was staffed with Shaune Pollack DNP who recommended a inpatient admission.     Diagnosis: F33.3 MDD recurrent  With psychotic features, severe, PTSD Cannabis abuse  Past Medical History:  Past Medical History:  Diagnosis  Date  . Chronic tonsillitis 03/2012  . Depression    no current meds.    Past Surgical History:  Procedure Laterality Date  . TONSILLECTOMY  03/29/2012   Procedure: TONSILLECTOMY;  Surgeon: Serena Colonel, MD;  Location: [facility];   Service: ENT;  Laterality: N/A;  . WISDOM TOOTH EXTRACTION      Family History:  Family History  Problem Relation Age of Onset  . Diabetes Neg Hx        ?  . Heart failure Neg Hx   . Colon cancer Neg Hx   . Prostate cancer Neg Hx     Social History:  reports that he has been smoking cigarettes. He has a 7.00 pack-year smoking history. He has never used smokeless tobacco. He reports that he drinks alcohol. He reports that he does not use drugs.  Additional Social History:  Alcohol / Drug Use Pain Medications: See MAR Prescriptions: See MAR Over the Counter: See MAR History of alcohol / drug use?: Yes Longest period of sobriety (when/how long): Unknown Negative Consequences of Use: (Denies) Withdrawal Symptoms: (Denies) Substance #1 Name of Substance 1: Cannabis 1 - Age of First Use: 18 1 - Amount (size/oz): Pt reports different amounts 1 - Frequency: Daily 1 - Duration: Last "few years" 1 - Last Use / Amount: 05/17/18 Pt reports a "Joint"   CIWA: CIWA-Ar BP: (!) 159/94 Pulse Rate: (!) 112 COWS:    Allergies: No Known Allergies  Home Medications:  (Not in a hospital admission)  OB/GYN Status:  No LMP for male patient.  General Assessment Data [facility] of [facility] Assessment: In system Is this a Tele or Face-to-Face Assessment?: Face-to-Face Is this an Initial Assessment or a Re-assessment for this encounter?: Initial Assessment Patient Accompanied by:: (NA) Language Other than English: No Living Arrangements: Event organiser) What gender do you identify as?: Male Marital status: Single Maiden name: NA Pregnancy Status: No Living Arrangements: Parent Can pt return to current living arrangement?: Yes Admission Status: Voluntary Is patient capable of signing voluntary admission?: Yes Referral Source: Self/Family/Friend Insurance type: Self Pay  Medical Screening Exam [facility] Walk-in ONLY) Medical Exam completed: Yes  Crisis Care Plan Living  Arrangements: Parent Legal Guardian: (NA) Name of Psychiatrist: None Name of Therapist: None  Education Status Is patient currently in school?: No Is the patient employed, unemployed or receiving disability?: Unemployed  Risk to self with the past 6 months Suicidal Ideation: Yes-Currently Present Has patient been a risk to self within the past 6 months prior to admission? : No Suicidal Intent: No Has patient had any suicidal intent within the past 6 months prior to admission? : No Is patient at risk for suicide?: Yes Suicidal Plan?: No Has patient had any suicidal plan within the past 6 months prior to admission? : No Access to Means: No What has been your use of drugs/alcohol within the last 12 months?: Current use Previous Attempts/Gestures: Yes How many times?: 0 Other Self Harm Risks: (Excessive SA use) Triggers for Past Attempts: Unknown Intentional Self Injurious Behavior: None Family Suicide History: No Recent stressful life event(s): Other (Comment)(Relationship issues) Persecutory voices/beliefs?: No Depression: Yes Depression Symptoms: Feeling worthless/self pity Substance abuse history and/or treatment for substance abuse?: No Suicide prevention information given to non-admitted patients: Not applicable  Risk to Others within the past 6 months Homicidal Ideation: No Does patient have any lifetime risk of violence toward others beyond the six months prior to admission? : No Thoughts of Harm to  Others: No Current Homicidal Intent: No Current Homicidal Plan: No Access to Homicidal Means: No Identified Victim: NA History of harm to others?: No Assessment of Violence: None Noted Violent Behavior Description: NA Does patient have access to weapons?: No Criminal Charges Pending?: No Does patient have a court date: No Is patient on probation?: No  Psychosis Hallucinations: Auditory Delusions: None noted  Mental Status Report Appearance/Hygiene:  Unremarkable Eye Contact: Fair Motor Activity: Freedom of movement Speech: Logical/coherent Level of Consciousness: Quiet/awake Mood: Depressed Affect: Appropriate to circumstance Anxiety Level: Moderate Thought Processes: Coherent, Relevant Judgement: Partial Orientation: Person, Place, Time Obsessive Compulsive Thoughts/Behaviors: None  Cognitive Functioning Concentration: Normal Memory: Recent Intact, Remote Intact Is patient IDD: No Insight: Fair Impulse Control: Poor Appetite: Fair Have you had any weight changes? : No Change Sleep: No Change Total Hours of Sleep: 7 Vegetative Symptoms: None  ADLScreening [facility] Assessment Services) Patient's cognitive ability adequate to safely complete daily activities?: Yes Patient able to express need for assistance with ADLs?: Yes Independently performs ADLs?: Yes (appropriate for developmental age)  Prior Inpatient Therapy Prior Inpatient Therapy: No  Prior Outpatient Therapy Prior Outpatient Therapy: Yes Prior Therapy Dates: Ongoing Prior Therapy Facilty/Provider(s): [facility] Reason for Treatment: MH issues Does patient have an ACCT team?: No Does patient have Intensive In-House Services?  : No Does patient have [facility] services? : No Does patient have P4CC services?: No  ADL Screening (condition at time of admission) Patient's cognitive ability adequate to safely complete daily activities?: Yes Is the patient deaf or have difficulty hearing?: No Does the patient have difficulty seeing, even when wearing glasses/contacts?: No Does the patient have difficulty concentrating, remembering, or making decisions?: No Patient able to express need for assistance with ADLs?: Yes Does the patient have difficulty dressing or bathing?: No Independently performs ADLs?: Yes (appropriate for developmental age) Does the patient have difficulty walking or climbing stairs?: No Weakness of Legs: None Weakness of Arms/Hands:  None  Home Assistive Devices/Equipment Home Assistive Devices/Equipment: None  Therapy Consults (therapy consults require a physician order) PT Evaluation Needed: No OT Evalulation Needed: No SLP Evaluation Needed: No Abuse/Neglect Assessment (Assessment to be complete while patient is alone) Physical Abuse: Denies Verbal Abuse: Denies Sexual Abuse: Denies Exploitation of patient/patient's resources: Denies Self-Neglect: Denies Values / Beliefs Cultural Requests During Hospitalization: None Spiritual Requests During Hospitalization: None Consults Spiritual Care Consult Needed: No Social Work Consult Needed: No Merchant navy officer (For Healthcare) Does Patient Have a Medical Advance Directive?: No Would patient like information on creating a medical advance directive?: No - Patient declined          Disposition: Case was staffed with Shaune Pollack DNP who recommended a inpatient admission.  Disposition Initial Assessment Completed for this Encounter: Yes Disposition of Patient: Admit Type of inpatient treatment program: Adult Patient refused recommended treatment: No  On Site Evaluation by:   Reviewed with Physician:    Alfredia Ferguson 05/18/2018 1:11 PM

## 2018-05-18 NOTE — ED Notes (Signed)
Pt sitting quielty, nad.  Pt denies si/hi /vh at this time, but reports that he does hear voices-chatter- at times.  Pt also reports that he has been having feet pain/tingling for the past 3 days.  No injury, not worse with walking.

## 2018-05-18 NOTE — ED Notes (Addendum)
NP into see 

## 2018-05-18 NOTE — ED Notes (Addendum)
Pt's sister into see 

## 2018-05-18 NOTE — BH Assessment (Signed)
[facility] Assessment Progress Note   Case was staffed with Lord DNP who recommended a inpatient admission.   

## 2018-05-18 NOTE — Progress Notes (Addendum)
This patient continues to meet inpatient criteria. CSW faxed information to the following facilities:    [facility]- Has beds today, will review [facility]- Under review [facility]-  [facility]- Has beds today, currently under review [facility]- Declined [place]   [facility]- No beds [place]- No Beds  Enid Cutter, [place] Clinical Social Worker [phone]

## 2018-05-18 NOTE — ED Notes (Signed)
Pt alert and oriented, pt denies any si, hi, or avh. Pt complains of a headache. Pt resting quietly in bed, will continue to monitor.

## 2018-05-18 NOTE — ED Provider Notes (Signed)
[facility]-EMERGENCY DEPT Provider Note   CSN: [id number] Arrival date & time: 05/18/18  0849     History   Chief Complaint Chief Complaint  Patient presents with  . Mental Health Problem    HPI Alexander Franklin is a 37 y.o. male.  37 year old male with history of PTSD presents with paranoia and auditory hallucinations.  Patient states that voices in his head are telling him to do things but he is unsure of what they are saying.  Patient feels as if there are people watching him and that his legs are being controlled by somebody else.  He denies any suicidal or homicidal ideations.  He uses marijuana daily but denies any use of alcohol.  Patient does not take any current psychiatric medications.  Symptoms have been progressive over the last few days and his family finally made him come to the ED today     Past Medical History:  Diagnosis Date  . Chronic tonsillitis 03/2012  . Depression    no current meds.    Patient Active Problem List   Diagnosis Date Noted  . Annual physical exam 12/22/2013  . Genital warts 12/22/2013  . Tonsillitis 03/14/2012  . Pharyngitis, acute 01/04/2012    Past Surgical History:  Procedure Laterality Date  . TONSILLECTOMY  03/29/2012   Procedure: TONSILLECTOMY;  Surgeon: Serena Colonel, MD;  Location: [facility];  Service: ENT;  Laterality: N/A;  . WISDOM TOOTH EXTRACTION          Home Medications    Prior to Admission medications   Not on File    Family History Family History  Problem Relation Age of Onset  . Diabetes Neg Hx        ?  . Heart failure Neg Hx   . Colon cancer Neg Hx   . Prostate cancer Neg Hx     Social History Social History   Tobacco Use  . Smoking status: Current Every Day Smoker    Packs/day: 0.50    Years: 14.00    Pack years: 7.00    Types: Cigarettes  . Smokeless tobacco: Never Used  . Tobacco comment: 1/2 ppd  Substance Use Topics  . Alcohol use: Yes    Comment:  occasionally   . Drug use: No     Allergies   Patient has no known allergies.   Review of Systems Review of Systems  All other systems reviewed and are negative.    Physical Exam Updated Vital Signs BP (!) 159/94   Pulse (!) 112   Temp 98.4 F (36.9 C)   Resp 18   SpO2 100%   Physical Exam  Constitutional: He is oriented to person, place, and time. He appears well-developed and well-nourished.  Non-toxic appearance. No distress.  HENT:  Head: Normocephalic and atraumatic.  Eyes: Pupils are equal, round, and reactive to light. Conjunctivae, EOM and lids are normal.  Neck: Normal range of motion. Neck supple. No tracheal deviation present. No thyroid mass present.  Cardiovascular: Normal rate, regular rhythm and normal heart sounds. Exam reveals no gallop.  No murmur heard. Pulmonary/Chest: Effort normal and breath sounds normal. No stridor. No respiratory distress. He has no decreased breath sounds. He has no wheezes. He has no rhonchi. He has no rales.  Abdominal: Soft. Normal appearance and bowel sounds are normal. He exhibits no distension. There is no tenderness. There is no rebound and no CVA tenderness.  Musculoskeletal: Normal range of motion. He exhibits no edema  or tenderness.  Neurological: He is alert and oriented to person, place, and time. He has normal strength. No cranial nerve deficit or sensory deficit. GCS eye subscore is 4. GCS verbal subscore is 5. GCS motor subscore is 6.  Skin: Skin is warm and dry. No abrasion and no rash noted.  Psychiatric: His affect is blunt. His speech is delayed. He is withdrawn and actively hallucinating. Thought content is paranoid. He expresses no homicidal and no suicidal ideation. He expresses no suicidal plans and no homicidal plans.  Nursing note and vitals reviewed.    ED Treatments / Results  Labs (all labs ordered are listed, but only abnormal results are displayed) Labs Reviewed  COMPREHENSIVE METABOLIC PANEL -  Abnormal; Notable for the following components:      Result Value   Sodium 134 (*)    CO2 20 (*)    Glucose, Bld 101 (*)    Total Protein 8.4 (*)    All other components within normal limits  CBC - Abnormal; Notable for the following components:   WBC 13.7 (*)    All other components within normal limits  ETHANOL  RAPID URINE DRUG SCREEN, HOSP PERFORMED    EKG None  Radiology No results found.  Procedures Procedures (including critical care time)  Medications Ordered in ED Medications - No data to display   Initial Impression / Assessment and Plan / ED Course  I have reviewed the triage vital signs and the nursing notes.  Pertinent labs & imaging results that were available during my care of the patient were reviewed by me and considered in my medical decision making (see chart for details).     Patient is medically clear at this time for psychiatric disposition.  Suspect that his symptoms are from his PTSD  Final Clinical Impressions(s) / ED Diagnoses   Final diagnoses:  None    ED Discharge Orders    None       Lorre Nick, MD 05/18/18 1019

## 2018-05-18 NOTE — ED Notes (Signed)
Note: all of pt's. Belongings were taken home by his mom.

## 2018-05-18 NOTE — ED Notes (Signed)
PT family have PT belongings

## 2018-05-18 NOTE — ED Notes (Signed)
Bed: WA27 Expected date:  Expected time:  Means of arrival:  Comments: triage 

## 2018-05-18 NOTE — ED Notes (Signed)
Pt belongings were taken home by pt family member.

## 2018-05-18 NOTE — ED Notes (Signed)
Pt ambulatory w/o difficulty from triage 

## 2018-05-19 DIAGNOSIS — F419 Anxiety disorder, unspecified: Secondary | ICD-10-CM

## 2018-05-19 DIAGNOSIS — F209 Schizophrenia, unspecified: Secondary | ICD-10-CM

## 2018-05-19 MED ORDER — HYDROXYZINE HCL 25 MG PO TABS
50.0000 mg | ORAL_TABLET | Freq: Once | ORAL | Status: AC
  Administered 2018-05-19: 50 mg via ORAL
  Filled 2018-05-19: qty 2

## 2018-05-19 MED ORDER — ASENAPINE MALEATE 5 MG SL SUBL
10.0000 mg | SUBLINGUAL_TABLET | Freq: Two times a day (BID) | SUBLINGUAL | Status: DC
  Administered 2018-05-19 – 2018-05-20 (×3): 10 mg via SUBLINGUAL
  Filled 2018-05-19 (×3): qty 2

## 2018-05-19 NOTE — ED Notes (Signed)
Patient called 911 and told them that he was being electrocuted. Writer tried to enter into therapeutic discussion patient. Writer unable to reason with patient. Patient given 10:00 pm medication early for agitation. Encouragement and support provided and safety maintain. Q 15 min safety checks remain in place an video monitoring.

## 2018-05-19 NOTE — Progress Notes (Addendum)
This patient continues to meet inpatient criteria. CSW faxed information to the following facilities:    [facility]- Has beds today, will review [facility]- Under review [facility]-  [facility]- Declined due to accuity [facility]- Declined [place]   [facility]- [facility] beds [facility]- No beds [place]- No Beds  Enid Cutter, [place] Clinical Social Worker [phone]

## 2018-05-19 NOTE — ED Notes (Signed)
[facility] PA SPENCER SIMON CALLED. PT APPEARED AGITATED, PACING THE HALLS. SPENCER GAVE VERBAL READ BACK ORDER OF A ONE TIME DOSE OF ATARAX 50MG  PO.

## 2018-05-19 NOTE — Consult Note (Addendum)
``[facility] Face-to-Face Psychiatry Consult   Reason for Consult:  Paranoia and delusional behavior Referring Physician:  edp Patient Identification: Alexander Franklin MRN:  [id number] Principal Diagnosis: Chronic schizophrenia with acute exacerbation [facility]) Diagnosis:   Patient Active Problem List   Diagnosis Date Noted  . Chronic schizophrenia with acute exacerbation ([place]) [F20.9] 05/19/2018    Priority: High  . Annual physical exam [G28.36] 12/22/2013  . Genital warts [A63.0] 12/22/2013  . Tonsillitis [J03.90] 03/14/2012  . Pharyngitis, acute [J02.9] 01/04/2012    Total Time spent with patient: 45 minutes  Subjective:   Alexander Franklin is a 37 y.o. male patient admitted with paranoia and audible hallucinations.    HPI:   On assessment he appears more anxious and paranoid today. He denies suicidal or homicidal ideations. He denies visual hallucinations.  He does endorse audible hallucinations.  He reports constantly hearing voices but remains unclear of what is being said to him. His delusions have progressed since his initial  assessment on yesterday.  He states the voices have become more agitated today and are out to get him.  He is laying in the bed with the foot of the bed elevated because he states a device had been inserted in his feet causing vibration.  His delusions have expanded to include his neighbors.  He now believes his girlfriend and her mother are in danger as well.  He does not appear to be responding to internal stimuli.  His symptoms are aggravated by marijuana usage, last reported use was one day prior and his previous urine was positive for THC.  While hospitalized prn medications and decreased stimulation have helped decrease his symptoms.  His symptoms started a few days ago.  Although he is noted to be calm and resting in bed with the television on prior to the mental health team entering his room.    Past Psychiatric History:  Self reported history of PTSD  Risk to Self:  Suicidal Ideation: Yes-Currently Present Suicidal Intent: No Is patient at risk for suicide?: Yes Suicidal Plan?: No Access to Means: No What has been your use of drugs/alcohol within the last 12 months?: Current use How many times?: 0 Other Self Harm Risks: (Excessive SA use) Triggers for Past Attempts: Unknown Intentional Self Injurious Behavior: None Risk to Others: Homicidal Ideation: No Thoughts of Harm to Others: No Current Homicidal Intent: No Current Homicidal Plan: No Access to Homicidal Means: No Identified Victim: NA History of harm to others?: No Assessment of Violence: None Noted Violent Behavior Description: NA Does patient have access to weapons?: No Criminal Charges Pending?: No Does patient have a court date: No Prior Inpatient Therapy: Prior Inpatient Therapy: No Prior Outpatient Therapy: Prior Outpatient Therapy: Yes Prior Therapy Dates: Ongoing Prior Therapy [facility] Reason for Treatment: MH issues Does patient have an ACCT team?: No Does patient have Intensive In-House Services?  : No Does patient have [facility] services? : No Does patient have P4CC services?: No  Past Medical History:  Past Medical History:  Diagnosis Date  . Chronic tonsillitis 03/2012  . Depression    no current meds.    Past Surgical History:  Procedure Laterality Date  . TONSILLECTOMY  03/29/2012   Procedure: TONSILLECTOMY;  Surgeon: Izora Gala, MD;  Location: [place];  Service: ENT;  Laterality: N/A;  . WISDOM TOOTH EXTRACTION     Family History:  Family History  Problem Relation Age of Onset  . Diabetes Neg Hx        ?  Marland Kitchen  Heart failure Neg Hx   . Colon cancer Neg Hx   . Prostate cancer Neg Hx    Family Psychiatric  History: NONE Social History:  Social History   Substance and Sexual Activity  Alcohol Use Yes   Comment: occasionally      Social History   Substance and Sexual Activity  Drug Use No    Social History    Socioeconomic History  . Marital status: Single    Spouse name: Not on file  . Number of children: 0  . Years of education: Not on file  . Highest education level: Not on file  Occupational History  . Occupation: Air cabin crew: [employer]  . Financial resource strain: Not on file  . Food insecurity:    Worry: Not on file    Inability: Not on file  . Transportation needs:    Medical: Not on file    Non-medical: Not on file  Tobacco Use  . Smoking status: Current Every Day Smoker    Packs/day: 0.50    Years: 14.00    Pack years: 7.00    Types: Cigarettes  . Smokeless tobacco: Never Used  . Tobacco comment: 1/2 ppd  Substance and Sexual Activity  . Alcohol use: Yes    Comment: occasionally   . Drug use: No  . Sexual activity: Not on file  Lifestyle  . Physical activity:    Days per week: Not on file    Minutes per session: Not on file  . Stress: Not on file  Relationships  . Social connections:    Talks on phone: Not on file    Gets together: Not on file    Attends religious service: Not on file    Active member of club or organization: Not on file    Attends meetings of clubs or organizations: Not on file    Relationship status: Not on file  Other Topics Concern  . Not on file  Social History Narrative   Sister lives w/ pt    Additional Social History:    Allergies:  No Known Allergies  Labs:  Results for orders placed or performed during the hospital encounter of 05/18/18 (from the past 48 hour(s))  Comprehensive metabolic panel     Status: Abnormal   Collection Time: 05/18/18  9:28 AM  Result Value Ref Range   Sodium 134 (L) 135 - 145 mmol/L   Potassium 3.7 3.5 - 5.1 mmol/L   Chloride 105 98 - 111 mmol/L   CO2 20 (L) 22 - 32 mmol/L   Glucose, Bld 101 (H) 70 - 99 mg/dL   BUN 9 6 - 20 mg/dL   Creatinine, Ser 0.97 0.61 - 1.24 mg/dL   Calcium 9.2 8.9 - 10.3 mg/dL   Total Protein 8.4 (H) 6.5 - 8.1 g/dL   Albumin  4.6 3.5 - 5.0 g/dL   AST 26 15 - 41 U/L   ALT 25 0 - 44 U/L   Alkaline Phosphatase 76 38 - 126 U/L   Total Bilirubin 0.8 0.3 - 1.2 mg/dL   GFR calc non Af Amer >60 >60 mL/min   GFR calc Af Amer >60 >60 mL/min    Comment: (NOTE) The eGFR has been calculated using the CKD EPI equation. This calculation has not been validated in all clinical situations. eGFR's persistently <60 mL/min signify possible Chronic Kidney Disease.    Anion gap 9 5 - 15    Comment: Performed  at [facility], [place] [address], [place], [place] [address]  Ethanol     Status: None   Collection Time: 05/18/18  9:28 AM  Result Value Ref Range   Alcohol, Ethyl (B) <10 <10 mg/dL    Comment: (NOTE) Lowest detectable limit for serum alcohol is 10 mg/dL. For medical purposes only. Performed at [facility], [place] [address] [place], [place] [address]   cbc     Status: Abnormal   Collection Time: 05/18/18  9:28 AM  Result Value Ref Range   WBC 13.7 (H) 4.0 - 10.5 K/uL   RBC 5.46 4.22 - 5.81 MIL/uL   Hemoglobin 14.9 13.0 - 17.0 g/dL   HCT 46.8 39.0 - 52.0 %   MCV 85.7 80.0 - 100.0 fL   MCH 27.3 26.0 - 34.0 pg   MCHC 31.8 30.0 - 36.0 g/dL   RDW 14.1 11.5 - 15.5 %   Platelets 339 150 - 400 K/uL   nRBC 0.0 0.0 - 0.2 %    Comment: Performed at [facility], [place] [address] [place], [place] [address]  Rapid urine drug screen (hospital performed)     Status: Abnormal   Collection Time: 05/18/18 11:17 AM  Result Value Ref Range   Opiates NONE DETECTED NONE DETECTED   Cocaine NONE DETECTED NONE DETECTED   Benzodiazepines NONE DETECTED NONE DETECTED   Amphetamines NONE DETECTED NONE DETECTED   Tetrahydrocannabinol POSITIVE (A) NONE DETECTED   Barbiturates NONE DETECTED NONE DETECTED    Comment: (NOTE) DRUG SCREEN FOR MEDICAL PURPOSES ONLY.  IF CONFIRMATION IS NEEDED FOR ANY PURPOSE, NOTIFY LAB WITHIN 5 DAYS. LOWEST DETECTABLE LIMITS FOR URINE DRUG  SCREEN Drug Class                     Cutoff (ng/mL) Amphetamine and metabolites    1000 Barbiturate and metabolites    200 Benzodiazepine                 932 Tricyclics and metabolites     300 Opiates and metabolites        300 Cocaine and metabolites        300 THC                            50 Performed at [facility], [place] [address], [place], [place] [address]     Current Facility-Administered Medications  Medication Dose Route Frequency Provider Last Rate Last Dose  . acetaminophen (TYLENOL) tablet 1,000 mg  1,000 mg Oral Q6H PRN Patrecia Pour, NP   1,000 mg at 05/18/18 1957  . OLANZapine (ZYPREXA) tablet 5 mg  5 mg Oral BID Patrecia Pour, NP   5 mg at 05/19/18 2202   No current outpatient medications on file.    Musculoskeletal: Strength & Muscle Tone: within normal limits Gait & Station: normal Patient leans: N/A  Psychiatric Specialty Exam: Physical Exam  Nursing note and vitals reviewed. Constitutional: He is oriented to person, place, and time. He appears well-developed and well-nourished.  HENT:  Head: Normocephalic.  Eyes: Pupils are equal, round, and reactive to light.  Neck: Normal range of motion.  Respiratory: Effort normal.  Musculoskeletal: Normal range of motion.  Neurological: He is alert and oriented to person, place, and time.  Psychiatric: His speech is normal. His mood appears anxious. His affect is blunt. He is actively hallucinating. Thought content is paranoid  and delusional. Cognition and memory are impaired. He expresses inappropriate judgment.    Review of Systems  Psychiatric/Behavioral: Positive for hallucinations. The patient is nervous/anxious.   All other systems reviewed and are negative.   Blood pressure 140/84, pulse 89, temperature 98.4 F (36.9 C), temperature source Oral, resp. rate 16, SpO2 100 %.There is no height or weight on file to calculate BMI.  General Appearance: Bizarre  Eye Contact:  Fair   Speech:  Normal Rate  Volume:  Normal  Mood:  Anxious  Affect:  Blunt and Flat  Thought Process:  Disorganized  Orientation:  Oriented to person  Thought Content:  Delusions and Paranoid Ideation  Suicidal Thoughts:  No  Homicidal Thoughts:  No  Memory:  Immediate;   Fair Recent;   Fair Remote;   Fair  Judgement:  Impaired  Insight:  Lacking  Psychomotor Activity:  Increased  Concentration:  Concentration: Fair and Attention Span: Fair  Recall:  [employer] of Knowledge:  Fair  Language:  Good  Akathisia:  NA  Handed:  Right  AIMS (if indicated):     Assets:  Communication Skills Desire for Improvement Resilience Social Support  ADL's:  Intact  Cognition:  Impaired,  Moderate  Sleep:        Treatment Plan Summary: Daily contact with patient to assess and evaluate symptoms and progress in treatment, Medication management and Plan Medication management:  Chronic schizophrenia with acute exacerbation  -Started Saphris 10 mg BID  Disposition: Recommend psychiatric Inpatient admission when medically cleared. Supportive therapy provided about ongoing stressors. Discussed crisis plan, support from social network, calling 911, coming to the Emergency Department, and calling Suicide Hotline.  Waylan Boga, NP 05/19/2018 11:09 AM  Patient seen face-to-face for psychiatric evaluation, chart reviewed and case discussed with the physician extender and developed treatment plan. Reviewed the information documented and agree with the treatment plan. Corena Pilgrim, MD

## 2018-05-19 NOTE — ED Notes (Addendum)
Pt pleasant on approach, cooperative. Pt delusional, paranoid. Referencing transmitters in his feet, and google earth/google maps.  Endorsing AH. Encouragement and support provided. Special checks q 15 mins in place for safety, Video monitoring in place. Will continue to monitor.

## 2018-05-19 NOTE — ED Notes (Signed)
Patient is delusional and paranoid. Patient appears to be responding to internal stimuli. Encouragement and support provided and safety maintain. Q 15 min safety checks remain in place and video monitoring.

## 2018-05-19 NOTE — ED Notes (Signed)
Visitor at bedside.

## 2018-05-20 MED ORDER — ASENAPINE MALEATE 5 MG SL SUBL: 10.0000 mg | SUBLINGUAL_TABLET | Freq: Two times a day (BID) | SUBLINGUAL | 0 refills | Status: AC

## 2018-05-20 NOTE — Discharge Instructions (Signed)
For your behavioral health needs, you are advised to follow up with the [facility]:       [facility]      [address]      [place], [place] [address]      [phone]  For supportive services for veterans in the [place] area, contact the [facility]:       [facility]      [address]      [place], [place] [address]      [phone]      Hours of operation: 8:00 am - 7:00 pm, Monday - Friday  For crisis services for veterans, call the Veterans Crisis Line, available 24 hours a day, 7 days a week:       [facility]ans Crisis Line      [phone] 

## 2018-05-20 NOTE — Consult Note (Addendum)
[facility] ED Discharge  05/20/2018 10:25 AM Alexander Franklin  MRN:  [id number] Principal Problem: Chronic schizophrenia with acute exacerbation [facility]) Discharge Diagnoses:  Patient Active Problem List   Diagnosis Date Noted  . Chronic schizophrenia with acute exacerbation ([facility]) [F20.9] 05/19/2018    Priority: High  . Annual physical exam [Z00.00] 12/22/2013  . Genital warts [A63.0] 12/22/2013  . Tonsillitis [J03.90] 03/14/2012  . Pharyngitis, acute [J02.9] 01/04/2012    Subjective: 37 yo male who presented to the ED with paranoia and delusions.  These increased with recent stressors with a girlfriend and decreased with medications.  Medications were started in the ED and he slept with minimal delusions left.  Appears to be at his baseline.  No suicidal/homicidal ideations, hallucinations, or withdrawal symptoms.  Encouraged him to stop cannabis use.  [facility]  Total Time spent with patient: 45 minutes  Past Psychiatric History: schizophrenia  Past Medical History:  Past Medical History:  Diagnosis Date  . Chronic tonsillitis 03/2012  . Depression    no current meds.   Past Surgical History:  Procedure Laterality Date  . TONSILLECTOMY  03/29/2012   Procedure: TONSILLECTOMY;  Surgeon: Serena Colonel, MD;  Location: [facility];  Service: ENT;  Laterality: N/A;  . WISDOM TOOTH EXTRACTION     Family History:  Family History  Problem Relation Age of Onset  . Diabetes Neg Hx        ?  . Heart failure Neg Hx   . Colon cancer Neg Hx   . Prostate cancer Neg Hx    Family Psychiatric  History: none Social History:  Social History   Substance and Sexual Activity  Alcohol Use Yes   Comment: occasionally     Social History   Substance and Sexual Activity  Drug Use No   Social History   Socioeconomic History  . Marital status: Single    Spouse name: Not on file  . Number of children: 0  . Years of education:  Not on file  . Highest education level: Not on file  Occupational History  . Occupation: Physiological scientist: [employer]  Social Needs  . Financial resource strain: Not on file  . Food insecurity:    Worry: Not on file    Inability: Not on file  . Transportation needs:    Medical: Not on file    Non-medical: Not on file  Tobacco Use  . Smoking status: Current Every Day Smoker    Packs/day: 0.50    Years: 14.00    Pack years: 7.00    Types: Cigarettes  . Smokeless tobacco: Never Used  . Tobacco comment: 1/2 ppd  Substance and Sexual Activity  . Alcohol use: Yes    Comment: occasionally   . Drug use: No  . Sexual activity: Not on file  Lifestyle  . Physical activity:    Days per week: Not on file    Minutes per session: Not on file  . Stress: Not on file  Relationships  . Social connections:    Talks on phone: Not on file    Gets together: Not on file    Attends religious service: Not on file    Active member of club or organization: Not on file    Attends meetings of clubs or organizations: Not on file    Relationship status: Not on file  Other Topics Concern  . Not  on file  Social History Narrative   Sister lives w/ pt     Has this patient used any form of tobacco in the last 30 days? (Cigarettes, Smokeless Tobacco, Cigars, and/or Pipes) A prescription for an FDA-approved tobacco cessation medication was offered at discharge and the patient refused  Current Medications: Current Facility-Administered Medications  Medication Dose Route Frequency Provider Last Rate Last Dose  . acetaminophen (TYLENOL) tablet 1,000 mg  1,000 mg Oral Q6H PRN Charm Rings, NP   1,000 mg at 05/18/18 1957  . asenapine (SAPHRIS) sublingual tablet 10 mg  10 mg Sublingual BID Charm Rings, NP   10 mg at 05/20/18 1610   Current Outpatient Medications  Medication Sig Dispense Refill  . asenapine (SAPHRIS) 5 MG SUBL 24 hr tablet Place 2 tablets (10 mg total) under  the tongue 2 (two) times daily. 60 tablet 0   PTA Medications:  (Not in a hospital admission)  Musculoskeletal: Strength & Muscle Tone: within normal limits Gait & Station: normal Patient leans: N/A  Psychiatric Specialty Exam: Physical Exam  Nursing note and vitals reviewed. Constitutional: He is oriented to person, place, and time. He appears well-developed and well-nourished.  HENT:  Head: Normocephalic and atraumatic.  Neck: Normal range of motion.  Respiratory: Effort normal.  Musculoskeletal: Normal range of motion.  Neurological: He is alert and oriented to person, place, and time.  Psychiatric: His speech is normal and behavior is normal. Judgment and thought content normal. His affect is blunt. Cognition and memory are normal.    Review of Systems  Psychiatric/Behavioral: Positive for substance abuse.  All other systems reviewed and are negative.   Blood pressure (!) 134/92, pulse 91, temperature 97.8 F (36.6 C), temperature source Oral, resp. rate 16, SpO2 100 %.There is no height or weight on file to calculate BMI.  General Appearance: Casual  Eye Contact:  Good  Speech:  Normal Rate  Volume:  Normal  Mood:  Euthymic  Affect:  Blunt  Thought Process:  Coherent and Descriptions of Associations: Intact, logical  Orientation:  Full (Time, Place, and Person)  Thought Content:  WDL and Logical  Suicidal Thoughts:  No  Homicidal Thoughts:  No  Memory:  Immediate;   Good Recent;   Good Remote;   Good  Judgement:  Fair  Insight:  Fair  Psychomotor Activity:  Normal  Concentration:  Concentration: Good and Attention Span: Good  Recall:  [employer] of Knowledge:  Fair  Language:  Good  Akathisia:  No  Handed:  Right  AIMS (if indicated):   N/A  Assets:  Housing Leisure Time Physical Health Resilience  ADL's:  Intact  Cognition:  WNL  Sleep:   Fair     Demographic Factors:  NA  Loss Factors: NA  Historical Factors: NA  Risk Reduction Factors:    Sense of responsibility to family, Living with another person, especially a relative, Positive social support and Positive therapeutic relationship  Continued Clinical Symptoms:  None   Cognitive Features That Contribute To Risk:  None    Suicide Risk:  Minimal: No identifiable suicidal ideation.  Patients presenting with no risk factors but with morbid ruminations; may be classified as minimal risk based on the severity of the depressive symptoms    Plan Of Care/Follow-up recommendations:  Activity:  as tolerated Diet:  heart healthy diet  Disposition: discharge home Nanine Means, NP 05/20/2018, 10:25 AM   Patient seen face-to-face for psychiatric evaluation, chart reviewed and case discussed with  the physician extender and developed treatment plan. Reviewed the information documented and agree with the treatment plan.  Juanetta Beets, DO 05/20/18 3:30 PM

## 2018-05-20 NOTE — Progress Notes (Addendum)
Intake and Assessment from [name] called again today to advise they are reviewing the patient.  Alexander Franklin. [name], MSW, LCSWA Disposition Clinical Social Work [phone] (cell) [phone] (office)

## 2018-05-20 NOTE — ED Notes (Signed)
Patient trying to get ride home.

## 2018-05-20 NOTE — BH Assessment (Signed)
[facility] Assessment Progress Note  Per Juanetta Beets, DO, this pt does not require psychiatric hospitalization at this time.  Pt is to be discharged from [facility] with recommendation to follow up with the [facility].  This has been included in pt's discharge instructions, along with other supportive services for veterans.  Pt's nurse, Aram Beecham, has been notified.  Doylene Canning, MA Triage Specialist [phone]

## 2018-05-20 NOTE — ED Notes (Signed)
Patient taking a shower.

## 2018-05-20 NOTE — ED Notes (Signed)
Patient walked out to nurse's station and asked if he could use the phone that he really needed to call someone real bad.  He said someone was plotting against a friend of his and that he was receiving messages from satellites and wanted to warn her.  Patient seemed very anxious, but returned to his bed and is sitting up in bed awake.
